# Patient Record
Sex: Male | Born: 1990 | Race: White | Hispanic: No | Marital: Married | Smoking: Never smoker
Health system: Southern US, Community
[De-identification: ages and names within clinical notes are randomized; demographics above are authoritative.]

## PROBLEM LIST (undated history)

## (undated) DIAGNOSIS — G4700 Insomnia, unspecified: Secondary | ICD-10-CM

## (undated) DIAGNOSIS — K219 Gastro-esophageal reflux disease without esophagitis: Secondary | ICD-10-CM

## (undated) DIAGNOSIS — F32A Depression, unspecified: Secondary | ICD-10-CM

## (undated) DIAGNOSIS — I639 Cerebral infarction, unspecified: Secondary | ICD-10-CM

## (undated) HISTORY — DX: Insomnia, unspecified: G47.00

## (undated) HISTORY — PX: KNEE SURGERY: SHX244

## (undated) HISTORY — DX: Gastro-esophageal reflux disease without esophagitis: K21.9

## (undated) HISTORY — DX: Depression, unspecified: F32.A

---

## 2019-04-18 ENCOUNTER — Other Ambulatory Visit: Payer: Self-pay

## 2019-04-18 DIAGNOSIS — Z20822 Contact with and (suspected) exposure to covid-19: Secondary | ICD-10-CM

## 2019-04-19 LAB — NOVEL CORONAVIRUS, NAA: SARS-CoV-2, NAA: NOT DETECTED

## 2019-05-03 ENCOUNTER — Other Ambulatory Visit: Payer: Self-pay

## 2019-05-03 DIAGNOSIS — Z20822 Contact with and (suspected) exposure to covid-19: Secondary | ICD-10-CM

## 2019-05-04 LAB — NOVEL CORONAVIRUS, NAA: SARS-CoV-2, NAA: NOT DETECTED

## 2019-08-06 ENCOUNTER — Ambulatory Visit: Payer: 59 | Attending: Internal Medicine

## 2019-08-06 DIAGNOSIS — Z20822 Contact with and (suspected) exposure to covid-19: Secondary | ICD-10-CM

## 2019-08-08 LAB — NOVEL CORONAVIRUS, NAA: SARS-CoV-2, NAA: NOT DETECTED

## 2021-04-20 ENCOUNTER — Encounter: Payer: Self-pay | Admitting: Neurology

## 2021-06-01 NOTE — Progress Notes (Signed)
NEUROLOGY CONSULTATION NOTE  Bobby Haynes MRN: [id number] DOB: 1990-12-04  Referring provider: [facility] Primary care provider: [facility]  Reason for consult:  headache  Assessment/Plan:   1   Migraine with aura, without status migrainosus, not intractable 2  Tension-type headache, not intractable  Migraine prevention:  He defers preventative medication.  Will try lifestyle modification and possibly vitamins/supplements (magnesium citrate 400mg  daily, riboflavin 400mg  daily, CoQ10 100mg  three times daily Migraine rescue:  rizatriptan 10mg  (Zofran for nausea) Limit use of pain relievers to no more than 2 days out of week to prevent risk of rebound or medication-overuse headache. Keep headache diary Follow up 6 months.    Subjective:  Bobby Haynes is a 30 year old left-handed male who presents for headaches.  History supplemented by referring provider's notes.  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 Location:  across forehead Quality: pounding Intensity:  5-6/10.   Aura:  sometimes preceded by blurred vision Prodrome:  absent Associated symptoms:  Nausea, photophobia, phonophobia.  He denies associated vomiting, autonomic symptoms, unilateral numbness or weakness. Duration:  several months Frequency:  varies but on average once a month Triggers:  change in weather Relieving factors:  rest Activity:  aggravates  September, he had a persistent migraine for 2 weeks.  Went to ED and found to have elevated blood pressure.  2.  Tension-type Headache Onset:  2021 after contracting Covid Location:  across forehead Quality:  throbbing, sometimes pressure Intensity:  3-4/10.   Aura:  absent Prodrome:  absent Associated symptoms:  None.   Duration:  all day Frequency:  every other day  Rescue protocol:  Excedrin (sometimes ibuprofen), Benadryl, Zofran Frequency of pain relievers: 3 to 4 times a  week Current NSAIDS/analgesics:  ibuprofen, Tylenol, Excedrin Current triptans:  none Current ergotamine:  none Current anti-emetic:  Zofran ODT 4mg  Current muscle relaxants:  none Current Antihypertensive medications:  none Current Antidepressant medications:  trazodone 50mg  daily Current Anticonvulsant medications:  none Current anti-CGRP:  none Current Vitamins/Herbal/Supplements:  none Current Antihistamines/Decongestants:  Benadryl, hydroxyzine Other therapy:  none Hormone/birth control:  none Other medications:  Buspar  Past NSAIDS/analgesics:  Tylenol Past abortive triptans:  none Past abortive ergotamine:  none Past muscle relaxants:  none Past anti-emetic:  none Past antihypertensive medications:  none Past antidepressant medications:  none Past anticonvulsant medications:  none Past anti-CGRP:  none Past vitamins/Herbal/Supplements:  none Past antihistamines/decongestants:  none Other past therapies:  none  Caffeine:  3 cups coffee daily.  Soda not daily Diet:  1 gallon of water daily.  Skips breakfast Exercise:  not routine Depression:  no; Anxiety:  yes Other pain:  knee pain (history of multiple surgeries) Sleep:  better with trazodone 6-7 hours a night Family history of headache:  mom and dad (migraines)      PAST MEDICAL HISTORY: History reviewed. No pertinent past medical history.  PAST SURGICAL HISTORY: History reviewed. No pertinent surgical history.  MEDICATIONS: Current Outpatient Medications on File Prior to Visit  Medication Sig Dispense Refill   albuterol (VENTOLIN HFA) 108 (90 Base) MCG/ACT inhaler INHALE 2 PUFFS BY MOUTH EVERY 6 HOURS AS NEEDED FOR WHEEZING     busPIRone (BUSPAR) 10 MG tablet TAKE ONE-HALF TABLET BY MOUTH TWICE A DAY FOR ANXIETY     Cholecalciferol 25 MCG (1000 UT) tablet Take 1 tablet by mouth daily.     dicyclomine (BENTYL) 10 MG capsule Take 10 mg by mouth daily.  hydrOXYzine (ATARAX/VISTARIL) 10 MG tablet Take 10 mg  by mouth as needed.     montelukast (SINGULAIR) 10 MG tablet Take 1 tablet by mouth daily.     traZODone (DESYREL) 50 MG tablet Take 50 mg by mouth daily.     No current facility-administered medications on file prior to visit.      ALLERGIES: No Known Allergies  FAMILY HISTORY: Family History  Problem Relation Age of Onset   Dementia Maternal Grandmother    Dementia Maternal Grandfather     Objective:  Blood pressure (!) 168/91, pulse 88, height 5\' 9"  (1.753 m), weight 255 lb (115.7 kg), SpO2 100 %. General: No acute distress.  Patient appears well-groomed.   Head:  Normocephalic/atraumatic Eyes:  fundi examined but not visualized Neck: supple, no paraspinal tenderness, full range of motion Back: No paraspinal tenderness Heart: regular rate and rhythm Lungs: Clear to auscultation bilaterally. Vascular: No carotid bruits. Neurological Exam: Mental status: alert and oriented to person, place, and time, recent and remote memory intact, fund of knowledge intact, attention and concentration intact, speech fluent and not dysarthric, language intact. Cranial nerves: CN I: not tested CN II: pupils equal, round and reactive to light, visual fields intact CN III, IV, VI:  full range of motion, no nystagmus, no ptosis CN V: facial sensation intact. CN VII: upper and lower face symmetric CN VIII: hearing intact CN IX, X: gag intact, uvula midline CN XI: sternocleidomastoid and trapezius muscles intact CN XII: tongue midline Bulk & Tone: normal, no fasciculations. Motor:  muscle strength 5/5 throughout Sensation:  Pinprick, temperature and vibratory sensation intact. Deep Tendon Reflexes:  2+ throughout,  toes downgoing.   Finger to nose testing:  Without dysmetria.   Heel to shin:  Without dysmetria.   Gait:  Normal station and stride.  Romberg negative.    Thank you for allowing me to take part in the care of this patient.  , DO

## 2021-06-02 ENCOUNTER — Encounter: Payer: Self-pay | Admitting: Neurology

## 2021-06-02 ENCOUNTER — Ambulatory Visit (INDEPENDENT_AMBULATORY_CARE_PROVIDER_SITE_OTHER): Payer: No Typology Code available for payment source | Admitting: Neurology

## 2021-06-02 VITALS — BP 168/91 | HR 88 | Ht 69.0 in | Wt 255.0 lb

## 2021-06-02 DIAGNOSIS — G43109 Migraine with aura, not intractable, without status migrainosus: Secondary | ICD-10-CM | POA: Diagnosis not present

## 2021-06-02 DIAGNOSIS — G44219 Episodic tension-type headache, not intractable: Secondary | ICD-10-CM

## 2021-06-02 MED ORDER — RIZATRIPTAN BENZOATE 10 MG PO TBDP: ORAL_TABLET | ORAL | 5 refills | Status: DC

## 2021-06-02 NOTE — Patient Instructions (Signed)
  Start magnesium citrate 400mg  daily, riboflavin 400mg  daily, coenzyme Q10 100mg  three times daily. Take rizatriptan 10mg  at earliest onset of headache.  May repeat dose once in 2 hours if needed.  Maximum 2 tablets in 24 hours.  Limit use to no more than 2 days out of week to prevent rebound headache Use ondansetron for nausea Stop over the counter analgesics (ibuprofen, Excedrin, Tylenol, etc) Be aware of common food triggers:  - Caffeine:  coffee, black tea, cola, Mt. Dew  - Chocolate  - Dairy:  aged cheeses (brie, blue, cheddar, gouda, [place], provolone, [place], Swiss, etc), chocolate milk, buttermilk, sour cream, limit eggs and yogurt  - Nuts, peanut butter  - Alcohol  - Cereals/grains:  FRESH breads (fresh bagels, sourdough, doughnuts), yeast productions  - Processed/canned/aged/cured meats (pre-packaged deli meats, hotdogs)  - MSG/glutamate:  soy sauce, flavor enhancer, pickled/preserved/marinated foods  - Sweeteners:  aspartame (Equal, Nutrasweet).  Sugar and Splenda are okay  - Vegetables:  legumes (lima beans, lentils, snow peas, fava beans, pinto peans, peas, garbanzo beans), sauerkraut, onions, olives, pickles  - Fruit:  avocados, bananas, citrus fruit (orange, lemon, grapefruit), mango  - Other:  Frozen meals, macaroni and cheese Routine exercise Stay adequately hydrated (aim for 64 oz water daily) Keep headache diary Maintain proper stress management Maintain proper sleep hygiene Do not skip meals

## 2021-06-11 ENCOUNTER — Telehealth: Payer: Self-pay | Admitting: Neurology

## 2021-06-11 NOTE — Telephone Encounter (Signed)
Last ov notes faxed to [phone]

## 2021-06-11 NOTE — Telephone Encounter (Signed)
[facility] called about this appointment. They want the notes sent to fax number [phone]. She stated they are backed up and need them faxed.

## 2021-07-13 ENCOUNTER — Ambulatory Visit: Payer: 59 | Admitting: Neurology

## 2021-09-04 ENCOUNTER — Inpatient Hospital Stay (HOSPITAL_COMMUNITY)
Admission: EM | Admit: 2021-09-04 | Discharge: 2021-09-07 | DRG: 065 | Disposition: A | Discharge status: Home / Self Care | Payer: No Typology Code available for payment source | Attending: Internal Medicine | Admitting: Internal Medicine

## 2021-09-04 ENCOUNTER — Other Ambulatory Visit: Payer: Self-pay

## 2021-09-04 ENCOUNTER — Emergency Department (HOSPITAL_COMMUNITY): Payer: No Typology Code available for payment source

## 2021-09-04 ENCOUNTER — Encounter (HOSPITAL_COMMUNITY): Payer: Self-pay | Admitting: Emergency Medicine

## 2021-09-04 DIAGNOSIS — Z79899 Other long term (current) drug therapy: Secondary | ICD-10-CM

## 2021-09-04 DIAGNOSIS — R29810 Facial weakness: Secondary | ICD-10-CM | POA: Diagnosis present

## 2021-09-04 DIAGNOSIS — K219 Gastro-esophageal reflux disease without esophagitis: Secondary | ICD-10-CM | POA: Diagnosis present

## 2021-09-04 DIAGNOSIS — G8194 Hemiplegia, unspecified affecting left nondominant side: Secondary | ICD-10-CM | POA: Diagnosis present

## 2021-09-04 DIAGNOSIS — G43109 Migraine with aura, not intractable, without status migrainosus: Secondary | ICD-10-CM | POA: Diagnosis present

## 2021-09-04 DIAGNOSIS — Q2112 Patent foramen ovale: Secondary | ICD-10-CM

## 2021-09-04 DIAGNOSIS — R29700 NIHSS score 0: Secondary | ICD-10-CM | POA: Diagnosis present

## 2021-09-04 DIAGNOSIS — Z820 Family history of epilepsy and other diseases of the nervous system: Secondary | ICD-10-CM

## 2021-09-04 DIAGNOSIS — Z20822 Contact with and (suspected) exposure to covid-19: Secondary | ICD-10-CM | POA: Diagnosis present

## 2021-09-04 DIAGNOSIS — I639 Cerebral infarction, unspecified: Secondary | ICD-10-CM | POA: Diagnosis present

## 2021-09-04 DIAGNOSIS — E785 Hyperlipidemia, unspecified: Secondary | ICD-10-CM | POA: Diagnosis present

## 2021-09-04 DIAGNOSIS — I6340 Cerebral infarction due to embolism of unspecified cerebral artery: Principal | ICD-10-CM | POA: Diagnosis present

## 2021-09-04 DIAGNOSIS — G4700 Insomnia, unspecified: Secondary | ICD-10-CM | POA: Diagnosis present

## 2021-09-04 DIAGNOSIS — F32A Depression, unspecified: Secondary | ICD-10-CM | POA: Diagnosis present

## 2021-09-04 LAB — DIFFERENTIAL
Abs Immature Granulocytes: 0.05 10*3/uL (ref 0.00–0.07)
Basophils Absolute: 0.1 10*3/uL (ref 0.0–0.1)
Basophils Relative: 1 %
Eosinophils Absolute: 0.3 10*3/uL (ref 0.0–0.5)
Eosinophils Relative: 2 %
Immature Granulocytes: 1 %
Lymphocytes Relative: 32 %
Lymphs Abs: 3.4 10*3/uL (ref 0.7–4.0)
Monocytes Absolute: 0.5 10*3/uL (ref 0.1–1.0)
Monocytes Relative: 5 %
Neutro Abs: 6.3 10*3/uL (ref 1.7–7.7)
Neutrophils Relative %: 59 %

## 2021-09-04 LAB — I-STAT CHEM 8, ED
BUN: 10 mg/dL (ref 6–20)
Calcium, Ion: 1.14 mmol/L — ABNORMAL LOW (ref 1.15–1.40)
Chloride: 105 mmol/L (ref 98–111)
Creatinine, Ser: 0.9 mg/dL (ref 0.61–1.24)
Glucose, Bld: 101 mg/dL — ABNORMAL HIGH (ref 70–99)
HCT: 45 % (ref 39.0–52.0)
Hemoglobin: 15.3 g/dL (ref 13.0–17.0)
Potassium: 3.5 mmol/L (ref 3.5–5.1)
Sodium: 141 mmol/L (ref 135–145)
TCO2: 24 mmol/L (ref 22–32)

## 2021-09-04 LAB — CBC
HCT: 45.6 % (ref 39.0–52.0)
Hemoglobin: 15 g/dL (ref 13.0–17.0)
MCH: 29.5 pg (ref 26.0–34.0)
MCHC: 32.9 g/dL (ref 30.0–36.0)
MCV: 89.8 fL (ref 80.0–100.0)
Platelets: 395 10*3/uL (ref 150–400)
RBC: 5.08 MIL/uL (ref 4.22–5.81)
RDW: 12.2 % (ref 11.5–15.5)
WBC: 10.6 10*3/uL — ABNORMAL HIGH (ref 4.0–10.5)
nRBC: 0 % (ref 0.0–0.2)

## 2021-09-04 LAB — COMPREHENSIVE METABOLIC PANEL
ALT: 56 U/L — ABNORMAL HIGH (ref 0–44)
AST: 52 U/L — ABNORMAL HIGH (ref 15–41)
Albumin: 4.4 g/dL (ref 3.5–5.0)
Alkaline Phosphatase: 72 U/L (ref 38–126)
Anion gap: 14 (ref 5–15)
BUN: 10 mg/dL (ref 6–20)
CO2: 23 mmol/L (ref 22–32)
Calcium: 9.4 mg/dL (ref 8.9–10.3)
Chloride: 104 mmol/L (ref 98–111)
Creatinine, Ser: 1.12 mg/dL (ref 0.61–1.24)
GFR, Estimated: >60 mL/min (ref >60)
Glucose, Bld: 97 mg/dL (ref 70–99)
Potassium: 3.6 mmol/L (ref 3.5–5.1)
Sodium: 141 mmol/L (ref 135–145)
Total Bilirubin: 0.5 mg/dL (ref 0.3–1.2)
Total Protein: 7.6 g/dL (ref 6.5–8.1)

## 2021-09-04 LAB — PROTIME-INR
INR: 0.9 (ref 0.8–1.2)
Prothrombin Time: 12.6 seconds (ref 11.4–15.2)

## 2021-09-04 LAB — APTT: aPTT: 28 seconds (ref 24–36)

## 2021-09-04 MED ORDER — MIDAZOLAM HCL 2 MG/2ML IJ SOLN
2.0000 mg | Freq: Once | INTRAMUSCULAR | Status: AC
  Administered 2021-09-05: 2 mg via INTRAVENOUS
  Filled 2021-09-04: qty 2

## 2021-09-04 NOTE — ED Provider Notes (Signed)
[facility] EMERGENCY DEPARTMENT Provider Note   CSN: [id number] Arrival date & time: 09/04/21  1634     History  Chief Complaint  Patient presents with   Neurologic Problem    Bobby Haynes is a 31 y.o. male.  Patient is a 31 year old male who presents with an episode of left-sided weakness and numbness.  He said he was having a bowel movement and started having numbness to the left side of his face as well as his left arm and left leg.  He said his left arm and leg felt weak.  He was having to drag his leg as he was walking.  His significant other who is at bedside says that he had some slurred speech during this time.  He said the left side of his face felt numb and was drooping.  He said this lasted about 5 minutes.  No history of similar symptoms in the past.  He does have a history of migraines and is followed by neurology.  He has never had any neurologic symptoms with his migraines.  He complains of a mild headache now but he thinks it is more related to him drinking alcohol last night.  He is not on anticoagulants.  No recent head injury.  He denies any drug use.  No significant past medical history.  No recent COVID symptoms.      Home Medications Prior to Admission medications   Medication Sig Start Date End Date Taking? Authorizing Provider  albuterol (VENTOLIN HFA) 108 (90 Base) MCG/ACT inhaler INHALE 2 PUFFS BY MOUTH EVERY 6 HOURS AS NEEDED FOR WHEEZING 09/21/20   [provider]  busPIRone (BUSPAR) 10 MG tablet TAKE ONE-HALF TABLET BY MOUTH TWICE A DAY FOR ANXIETY 04/14/21   [provider]  Cholecalciferol 25 MCG (1000 UT) tablet Take 1 tablet by mouth daily. 09/25/20   [provider]  dicyclomine (BENTYL) 10 MG capsule Take 10 mg by mouth daily. 11/02/20   [provider]  hydrOXYzine (ATARAX/VISTARIL) 10 MG tablet Take 10 mg by mouth as needed. 04/14/21   [provider]  montelukast (SINGULAIR) 10 MG  tablet Take 1 tablet by mouth daily. 03/01/21   [provider]  rizatriptan (MAXALT-MLT) 10 MG disintegrating tablet Take 1 tablet earliest onset of migraine.  May repeat in 2 hours if needed.  Maximum 2 tablets in 24 hours. 06/02/21   Pieter Partridge, DO  traZODone (DESYREL) 50 MG tablet Take 50 mg by mouth daily. 04/14/21   [provider]      Allergies    Patient has no known allergies.    Review of Systems   Review of Systems  Constitutional:  Negative for chills, diaphoresis, fatigue and fever.  HENT:  Negative for congestion, rhinorrhea and sneezing.   Eyes: Negative.   Respiratory:  Negative for cough, chest tightness and shortness of breath.   Cardiovascular:  Negative for chest pain and leg swelling.  Gastrointestinal:  Negative for abdominal pain, blood in stool, diarrhea, nausea and vomiting.  Genitourinary:  Negative for difficulty urinating, flank pain, frequency and hematuria.  Musculoskeletal:  Negative for arthralgias and back pain.  Skin:  Negative for rash.  Neurological:  Positive for speech difficulty, weakness, numbness and headaches. Negative for dizziness.   Physical Exam Updated Vital Signs BP (!) 146/89    Pulse 97    Temp 97.9 F (36.6 C) (Oral)    Resp 16    SpO2 98%  Physical Exam Constitutional:  Appearance: He is well-developed.  HENT:     Head: Normocephalic and atraumatic.  Eyes:     Pupils: Pupils are equal, round, and reactive to light.  Cardiovascular:     Rate and Rhythm: Normal rate and regular rhythm.     Heart sounds: Normal heart sounds.  Pulmonary:     Effort: Pulmonary effort is normal. No respiratory distress.     Breath sounds: Normal breath sounds. No wheezing or rales.  Chest:     Chest wall: No tenderness.  Abdominal:     General: Bowel sounds are normal.     Palpations: Abdomen is soft.     Tenderness: There is no abdominal tenderness. There is no guarding or rebound.  Musculoskeletal:        General:  Normal range of motion.     Cervical back: Normal range of motion and neck supple.  Lymphadenopathy:     Cervical: No cervical adenopathy.  Skin:    General: Skin is warm and dry.     Findings: No rash.  Neurological:     Mental Status: He is alert and oriented to person, place, and time.     Comments: Motor 5/5 all extremities Sensation grossly intact to LT all extremities Finger to Nose intact, no pronator drift CN II-XII grossly intact      ED Results / Procedures / Treatments   Labs (all labs ordered are listed, but only abnormal results are displayed) Labs Reviewed  CBC - Abnormal; Notable for the following components:      Result Value   WBC 10.6 (*)    All other components within normal limits  COMPREHENSIVE METABOLIC PANEL - Abnormal; Notable for the following components:   AST 52 (*)    ALT 56 (*)    All other components within normal limits  I-STAT CHEM 8, ED - Abnormal; Notable for the following components:   Glucose, Bld 101 (*)    Calcium, Ion 1.14 (*)    All other components within normal limits  PROTIME-INR  APTT  DIFFERENTIAL    EKG EKG Interpretation  Date/Time:  Saturday September 04 2021 16:55:20 EST Ventricular Rate:  106 PR Interval:  142 QRS Duration: 92 QT Interval:  330 QTC Calculation: 438 R Axis:   75 Text Interpretation: Sinus tachycardia Incomplete right bundle branch block Cannot rule out Anterior infarct , age undetermined Abnormal ECG No previous ECGs available No old tracing to compare Confirmed by Malvin Johns [phone]) on 09/04/2021 5:38:19 PM  Radiology CT HEAD WO CONTRAST  Result Date: 09/04/2021 CLINICAL DATA:  Numbness or tingling, paresthesia (Ped 0-17y). Left-sided weakness, numbness EXAM: CT HEAD WITHOUT CONTRAST TECHNIQUE: Contiguous axial images were obtained from the base of the skull through the vertex without intravenous contrast. RADIATION DOSE REDUCTION: This exam was performed according to the departmental  dose-optimization program which includes automated exposure control, adjustment of the mA and/or kV according to patient size and/or use of iterative reconstruction technique. COMPARISON:  None. FINDINGS: Brain: No acute intracranial abnormality. Specifically, no hemorrhage, hydrocephalus, mass lesion, acute infarction, or significant intracranial injury. Vascular: No hyperdense vessel or unexpected calcification. Skull: No acute calvarial abnormality. Sinuses/Orbits: No acute findings. Mucosal thickening in the maxillary sinuses. Other: None IMPRESSION: No acute intracranial abnormality. Chronic maxillary sinusitis. Electronically Signed   By: Rolm Baptise M.D.   On: 09/04/2021 17:49    Procedures Procedures    Medications Ordered in ED Medications - No data to display  ED Course/ Medical Decision Making/ A&P  Medical Decision Making Amount and/or Complexity of Data Reviewed Radiology: ordered.  Risk Prescription drug management.   19:11 consulted with Dr. Leonel Ramsay.  He recommends MR I of the brain and MRA of the head and neck.  If these are negative, symptoms are likely related to a complex migraine and he can follow-up with his outpatient neurologist.  Would recommend starting baby ASA.  Patient presents with an episode of left-sided weakness and facial drooping.  His symptoms resolved after about 5 minutes.  His head CT shows no concerning findings.  His labs were reviewed by me and are nonconcerning.  I reviewed his old chart and neurology records.  It does not appear that he has had similar symptoms in the past with his migraines.  He has a mild headache now but does not seem to have a current migraine.  Patient is awaiting MRI/MRA.  Care turned over to Dr. Dayna Barker pending these tests.  {Final Clinical Impression(s) / ED Diagnoses Final diagnoses:  None    Rx / DC Orders ED Discharge Orders     None         Malvin Johns, MD 09/04/21 2318

## 2021-09-04 NOTE — ED Notes (Signed)
Patient transported to CT 

## 2021-09-04 NOTE — ED Provider Notes (Signed)
11:21 PM Assumed care from Dr. Fredderick Phenix, please see their note for full history, physical and decision making until this point. In brief this is a 31 y.o. year old male who presented to the ED tonight with Neurologic Problem     Patient here with some left-sided symptoms concerning for possible TIA versus less likely complicated migraine.  Neurology phone consult and recommended MRI MRA and disposition pending on results.  MRI with acute stroke. D/w Neurology, requests medicine admit, will see for consult. Concern for PFO.   Labs, studies and imaging reviewed by myself and considered in medical decision making if ordered. Imaging interpreted by radiology.  Labs Reviewed  CBC - Abnormal; Notable for the following components:      Result Value   WBC 10.6 (*)    All other components within normal limits  COMPREHENSIVE METABOLIC PANEL - Abnormal; Notable for the following components:   AST 52 (*)    ALT 56 (*)    All other components within normal limits  I-STAT CHEM 8, ED - Abnormal; Notable for the following components:   Glucose, Bld 101 (*)    Calcium, Ion 1.14 (*)    All other components within normal limits  PROTIME-INR  APTT  DIFFERENTIAL    CT HEAD WO CONTRAST  Final Result    MR BRAIN WO CONTRAST    (Results Pending)  MR ANGIO HEAD WO CONTRAST    (Results Pending)  MR Angiogram Neck W or Wo Contrast    (Results Pending)    No follow-ups on file.    Mckenzye Cutright, Barbara Cower, MD 09/05/21 [phone]

## 2021-09-04 NOTE — ED Triage Notes (Signed)
Pt here for sudden onset L side weakness and numbness that started at 1600 today, per pt it lasted approx 5 minutes before going away. Pt has no unilateral weakness, droop, numbness at present.

## 2021-09-05 ENCOUNTER — Emergency Department (HOSPITAL_COMMUNITY): Payer: No Typology Code available for payment source

## 2021-09-05 ENCOUNTER — Observation Stay (HOSPITAL_COMMUNITY): Payer: No Typology Code available for payment source

## 2021-09-05 ENCOUNTER — Observation Stay (HOSPITAL_BASED_OUTPATIENT_CLINIC_OR_DEPARTMENT_OTHER): Payer: No Typology Code available for payment source

## 2021-09-05 DIAGNOSIS — I639 Cerebral infarction, unspecified: Secondary | ICD-10-CM | POA: Diagnosis not present

## 2021-09-05 DIAGNOSIS — I6389 Other cerebral infarction: Secondary | ICD-10-CM | POA: Diagnosis not present

## 2021-09-05 LAB — ECHOCARDIOGRAM COMPLETE BUBBLE STUDY
AR max vel: 2.77 cm2
AV Area VTI: 2.78 cm2
AV Area mean vel: 2.78 cm2
AV Mean grad: 3 mmHg
AV Peak grad: 5.8 mmHg
Ao pk vel: 1.2 m/s
Area-P 1/2: 4.96 cm2
S' Lateral: 3.2 cm

## 2021-09-05 LAB — RAPID URINE DRUG SCREEN, HOSP PERFORMED
Amphetamines: NOT DETECTED
Barbiturates: NOT DETECTED
Benzodiazepines: POSITIVE — AB
Cocaine: NOT DETECTED
Opiates: NOT DETECTED
Tetrahydrocannabinol: NOT DETECTED

## 2021-09-05 LAB — LIPID PANEL
Cholesterol: 230 mg/dL — ABNORMAL HIGH (ref 0–200)
HDL: 49 mg/dL (ref >40)
LDL Cholesterol: 158 mg/dL — ABNORMAL HIGH (ref 0–99)
Total CHOL/HDL Ratio: 4.7 RATIO
Triglycerides: 115 mg/dL (ref <150)
VLDL: 23 mg/dL (ref 0–40)

## 2021-09-05 LAB — RESP PANEL BY RT-PCR (FLU A&B, COVID) ARPGX2
Influenza A by PCR: NEGATIVE
Influenza B by PCR: NEGATIVE
SARS Coronavirus 2 by RT PCR: NEGATIVE

## 2021-09-05 LAB — ANTITHROMBIN III: AntiThromb III Func: 109 % (ref 75–120)

## 2021-09-05 LAB — HIV ANTIBODY (ROUTINE TESTING W REFLEX): HIV Screen 4th Generation wRfx: NONREACTIVE

## 2021-09-05 MED ORDER — IOHEXOL 350 MG/ML SOLN
75.0000 mL | Freq: Once | INTRAVENOUS | Status: AC | PRN
  Administered 2021-09-05: 75 mL via INTRAVENOUS

## 2021-09-05 MED ORDER — BUSPIRONE HCL 10 MG PO TABS
5.0000 mg | ORAL_TABLET | Freq: Every morning | ORAL | Status: DC
  Administered 2021-09-05 – 2021-09-07 (×3): 5 mg via ORAL
  Filled 2021-09-05 (×3): qty 1

## 2021-09-05 MED ORDER — CLOPIDOGREL BISULFATE 75 MG PO TABS: 75.0000 mg | ORAL_TABLET | Freq: Every day | ORAL | Status: DC

## 2021-09-05 MED ORDER — CLOPIDOGREL BISULFATE 300 MG PO TABS
300.0000 mg | ORAL_TABLET | Freq: Once | ORAL | Status: AC
  Administered 2021-09-05: 300 mg via ORAL
  Filled 2021-09-05: qty 1

## 2021-09-05 MED ORDER — TRAZODONE HCL 50 MG PO TABS
50.0000 mg | ORAL_TABLET | Freq: Every day | ORAL | Status: DC
  Administered 2021-09-05 – 2021-09-06 (×2): 50 mg via ORAL
  Filled 2021-09-05 (×2): qty 1

## 2021-09-05 MED ORDER — ENOXAPARIN SODIUM 40 MG/0.4ML IJ SOSY
40.0000 mg | PREFILLED_SYRINGE | INTRAMUSCULAR | Status: DC
  Administered 2021-09-05 – 2021-09-07 (×3): 40 mg via SUBCUTANEOUS
  Filled 2021-09-05 (×3): qty 0.4

## 2021-09-05 MED ORDER — GADOBUTROL 1 MMOL/ML IV SOLN
10.0000 mL | Freq: Once | INTRAVENOUS | Status: AC | PRN
  Administered 2021-09-05: 10 mL via INTRAVENOUS

## 2021-09-05 MED ORDER — MONTELUKAST SODIUM 10 MG PO TABS: 10.0000 mg | ORAL_TABLET | Freq: Every day | ORAL | Status: DC

## 2021-09-05 MED ORDER — STROKE: EARLY STAGES OF RECOVERY BOOK
Freq: Once | Status: AC
  Filled 2021-09-05: qty 1

## 2021-09-05 MED ORDER — MAGNESIUM 30 MG PO TABS: 30.0000 mg | ORAL_TABLET | Freq: Every day | ORAL | Status: DC

## 2021-09-05 MED ORDER — ACETAMINOPHEN 650 MG RE SUPP: 650.0000 mg | RECTAL | Status: DC | PRN

## 2021-09-05 MED ORDER — ASPIRIN EC 81 MG PO TBEC
81.0000 mg | DELAYED_RELEASE_TABLET | Freq: Every day | ORAL | Status: DC
  Administered 2021-09-05 – 2021-09-07 (×3): 81 mg via ORAL
  Filled 2021-09-05 (×3): qty 1

## 2021-09-05 MED ORDER — ACETAMINOPHEN 160 MG/5ML PO SOLN: 650.0000 mg | ORAL | Status: DC | PRN

## 2021-09-05 MED ORDER — ATORVASTATIN CALCIUM 40 MG PO TABS
40.0000 mg | ORAL_TABLET | Freq: Every day | ORAL | Status: DC
  Administered 2021-09-05 – 2021-09-07 (×3): 40 mg via ORAL
  Filled 2021-09-05 (×3): qty 1

## 2021-09-05 MED ORDER — CLOPIDOGREL BISULFATE 75 MG PO TABS
75.0000 mg | ORAL_TABLET | Freq: Every day | ORAL | Status: DC
  Administered 2021-09-06 – 2021-09-07 (×2): 75 mg via ORAL
  Filled 2021-09-05 (×2): qty 1

## 2021-09-05 MED ORDER — ACETAMINOPHEN 325 MG PO TABS: 650.0000 mg | ORAL_TABLET | ORAL | Status: DC | PRN

## 2021-09-05 NOTE — Evaluation (Signed)
Occupational Therapy Evaluation Patient Details Name: Bobby Haynes MRN: [id number] DOB: 11-Nov-1990 Today's Date: 09/05/2021   History of Present Illness Pt is a 31 y.o. male who presented 09/04/21 with L sided numbness, facial droop, and slurred speech that lasted 5 minutes. 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 PMH: obesity, migraines, depression, GERD   Clinical Impression   Pt admitted with the above diagnosis.  He presents to OT with no deficits and appears to be back to baseline.  He is able to perform ADLs and functional mobility independently.  No apparent visual or cognitive deficits noted with testing.   OT will sign off.       Recommendations for follow up therapy are one component of a multi-disciplinary discharge planning process, led by the attending physician.  Recommendations may be updated based on patient status, additional functional criteria and insurance authorization.   Follow Up Recommendations  No OT follow up    Assistance Recommended at Discharge None  Patient can return home with the following      Functional Status Assessment  Patient has not had a recent decline in their functional status  Equipment Recommendations  None recommended by OT    Recommendations for Other Services       Precautions / Restrictions Precautions Precautions: None      Mobility Bed Mobility Overal bed mobility: Independent                  Transfers Overall transfer level: Independent                        Balance Overall balance assessment: Independent                                         ADL either performed or assessed with clinical judgement   ADL Overall ADL's : Independent                                             Vision Baseline Vision/History: 1 Wears glasses Ability to See in Adequate Light: 0  Adequate Patient Visual Report: No change from baseline Vision Assessment?: Yes Eye Alignment: Within Functional Limits Ocular Range of Motion: Within Functional Limits Alignment/Gaze Preference: Within Defined Limits Tracking/Visual Pursuits: Able to track stimulus in all quads without difficulty Saccades: Within functional limits Convergence: Within functional limits Visual Fields: No apparent deficits     Perception     Praxis      Pertinent Vitals/Pain Pain Assessment Pain Assessment: No/denies pain     Hand Dominance Left   Extremity/Trunk Assessment Upper Extremity Assessment Upper Extremity Assessment: Overall WFL for tasks assessed   Lower Extremity Assessment Lower Extremity Assessment: Overall WFL for tasks assessed   Cervical / Trunk Assessment Cervical / Trunk Assessment: Normal   Communication Communication Communication: No difficulties   Cognition Arousal/Alertness: Awake/alert Behavior During Therapy: WFL for tasks assessed/performed Overall Cognitive Status: Within Functional Limits for tasks assessed                                 General Comments: Pt scored 2/28 on the Short Blessed Test -  only error was with auditory recall. He was able to perform serial 7 subtraction while ambulating without difficulty and was able to perform minimally challenging path finding     General Comments  pt able to turn, pick up items from floor, read signage in hallway without LOB.  He reports mild dizziness with head turns but does not loose balance.  Discussed post stroke fatigue with pt    Exercises     Shoulder Instructions      Home Living Family/patient expects to be discharged to:: Private residence Living Arrangements: Spouse/significant other Available Help at Discharge: Family Type of Home: Apartment Home Access: Stairs to enter Secretary/administrator of Steps: full flight Entrance Stairs-Rails: Left;Right Home Layout: One level      Bathroom Shower/Tub: Chief Strategy Officer: Standard     Home Equipment: None          Prior Functioning/Environment Prior Level of Function : Independent/Modified Independent;Driving;Working/employed             Mobility Comments: No AD ADLs Comments: 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 Reports his baseline auditory memory is not great and wife confirms.        OT Problem List: Decreased activity tolerance      OT Treatment/Interventions:      OT Goals(Current goals can be found in the care plan section) Acute Rehab OT Goals Patient Stated Goal: to go home soon OT Goal Formulation: All assessment and education complete, DC therapy  OT Frequency:      Co-evaluation              AM-PAC OT "6 Clicks" Daily Activity     Outcome Measure Help from another person eating meals?: None Help from another person taking care of personal grooming?: None Help from another person toileting, which includes using toliet, bedpan, or urinal?: None Help from another person bathing (including washing, rinsing, drying)?: None Help from another person to put on and taking off regular upper body clothing?: None Help from another person to put on and taking off regular lower body clothing?: None 6 Click Score: 24   End of Session Nurse Communication: Mobility status  Activity Tolerance: Patient tolerated treatment well Patient left: in bed;with family/visitor present  OT Visit Diagnosis: Dizziness and giddiness (R42)                Time: 8003-4917 OT Time Calculation (min): 16 min Charges:  OT General Charges $OT Visit: 1 Visit OT Evaluation $OT Eval Low Complexity: 1 Low  Eber Jones., OTR/L Acute Rehabilitation Services Pager [phone] Office [phone]   Jeani Hawking M 09/05/2021, 10:47 AM

## 2021-09-05 NOTE — Progress Notes (Signed)
°  Echocardiogram 2D Echocardiogram has been performed.  Bobby Haynes 09/05/2021, 8:27 AM

## 2021-09-05 NOTE — Progress Notes (Signed)
PT Cancellation & Discharge Note  Patient Details Name: Bobby Haynes MRN: [id number] DOB: [birth year]   Cancelled Treatment:    Reason Eval/Treat Not Completed: PT screened, no needs identified, will sign off. OT communicated with PT that pt is back to baseline, IND, with no balance deficits except some minor dizziness with changing head positions but no overt LOB. No PT needs identified, PT will sign off. Thank you.  Raymond Gurney, PT, DPT Acute Rehabilitation Services  Pager: [phone] Office: [phone]    Jewel Baize 09/05/2021, 10:59 AM

## 2021-09-05 NOTE — ED Notes (Signed)
Pt up with spouse to walk around unit

## 2021-09-05 NOTE — Progress Notes (Signed)
VASCULAR LAB    Bilateral lower extremity venous duplex has been performed.  See CV proc for preliminary results.   Jahmiyah Dullea, RVT 09/05/2021, 8:34 AM

## 2021-09-05 NOTE — H&P (View-Only) (Signed)
° ° °  [facility] has been requested to perform a transesophageal echocardiogram on Bobby Haynes for stroke.  After careful review of history and examination, the risks and benefits of transesophageal echocardiogram have been explained including risks of esophageal damage, perforation (1:10,000 risk), bleeding, pharyngeal hematoma as well as other potential complications associated with conscious sedation including aspiration, arrhythmia, respiratory failure and death. Alternatives to treatment were discussed, questions were answered. Patient is willing to proceed.   Tereso Newcomer, PA-C  09/05/2021 5:21 PM

## 2021-09-05 NOTE — ED Notes (Signed)
Pt given meal tray.

## 2021-09-05 NOTE — Progress Notes (Addendum)
STROKE TEAM PROGRESS NOTE   INTERVAL HISTORY His wife is at the bedside.  Patient reports having sudden onset left sided weakness, blurry vision, and dysarthria that lasted approximately 5 minutes. Patient denies having residual neurological deficits. Patient has hx of migraine with aura and takes medication prn. Patient reports he did have a headache that morning for which he took an ibuprofen.  Head CT showed no acute intracranial abnormality.  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 MRA shows wide patency of both carotid artery and R vertebral artery diffusely hypoplastic and terminates in PICA, dominant L vertebral artery. CTA also consistent with no abnormal anterior or posterior circulation.   Discussed plan to have TEE for tomorrow. On ASA+Plavix for DAPT, started lipitor 40 for dyslipidemia. LDL cholesterol 158, A1c pending.  Vitals:   09/05/21 0045 09/05/21 0339 09/05/21 0700 09/05/21 0737  BP: (!) 146/93 (!) 146/85  137/77  Pulse: 87 78  85  Resp: 19 18 18 18   Temp:      TempSrc:      SpO2: 96% 97%  97%   CBC:  Recent Labs  Lab 09/04/21 1650 09/04/21 1706  WBC 10.6*  --   NEUTROABS 6.3  --   HGB 15.0 15.3  HCT 45.6 45.0  MCV 89.8  --   PLT 395  --    Basic Metabolic Panel:  Recent Labs  Lab 09/04/21 1650 09/04/21 1706  NA 141 141  K 3.6 3.5  CL 104 105  CO2 23  --   GLUCOSE 97 101*  BUN 10 10  CREATININE 1.12 0.90  CALCIUM 9.4  --    Lipid Panel:  Recent Labs  Lab 09/05/21 0635  CHOL 230*  TRIG 115  HDL 49  CHOLHDL 4.7  VLDL 23  LDLCALC 409158*   HgbA1c: No results for input(s): HGBA1C in the last 168 hours. Urine Drug Screen:  Recent Labs  Lab 09/05/21 0543  LABOPIA NONE DETECTED  COCAINSCRNUR NONE DETECTED  LABBENZ POSITIVE*  AMPHETMU NONE DETECTED  THCU NONE DETECTED  LABBARB NONE DETECTED    Alcohol Level No results for input(s): ETH in the last 168 hours.  IMAGING  past 24 hours CT ANGIO HEAD W OR WO CONTRAST  Result Date: 09/05/2021 CLINICAL DATA:  31 year old male with tiny posterior right MCA cortical infarct on brain MRI this morning. EXAM: CT ANGIOGRAPHY HEAD AND NECK TECHNIQUE: Multidetector CT imaging of the head and neck was performed using the standard protocol during bolus administration of intravenous contrast. Multiplanar CT image reconstructions and MIPs were obtained to evaluate the vascular anatomy. Carotid stenosis measurements (when applicable) are obtained utilizing NASCET criteria, using the distal internal carotid diameter as the denominator. RADIATION DOSE REDUCTION: This exam was performed according to the departmental dose-optimization program which includes automated exposure control, adjustment of the mA and/or kV according to patient size and/or use of iterative reconstruction technique. CONTRAST:  75mL OMNIPAQUE IOHEXOL 350 MG/ML SOLN COMPARISON:  Brain MRI, MRA head and neck 0110 hours today. Head CT yesterday. FINDINGS: CT HEAD Brain: Small posterior right frontal lobe cortical infarct is not apparent by CT. No midline shift, ventriculomegaly, mass effect, evidence of mass lesion, intracranial hemorrhage. Gray-white matter differentiation is within normal limits throughout the brain. Normal cerebral volume. Calvarium and skull base: No acute osseous abnormality identified. Paranasal sinuses: Stable maxillary sinus mucosal thickening and/or retention cysts. Stable scattered mild ethmoid mucosal thickening. Tympanic cavities are clear. Stable right mastoid  effusion. Orbits: Visualized orbits and scalp soft tissues are within normal limits. CTA NECK Skeleton: No osseous abnormality identified. Upper chest: Negative. Other neck: Negative. Aortic arch: 3 vessel arch configuration. No arch atherosclerosis. Right carotid system: Mildly obscured right CCA origin due to dense right subclavian venous contrast streak artifact. Otherwise the right CCA,  right carotid bifurcation and cervical right ICA appear normal. Left carotid system: Normal. Vertebral arteries: Right subclavian artery origin obscured by dense right subclavian venous contrast. Non dominant right vertebral artery origin appears normal. 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 The right vertebral is patent to the skull base with no stenosis identified. Normal proximal left subclavian artery dominant left vertebral artery origin, and cervical left vertebral artery. CTA HEAD Posterior circulation: Left vertebral artery supplies the basilar. Diminutive right V4 segment terminates in PICA. No distal vertebral or basilar stenosis. Patent SCA and PCA origins. Fetal type right PCA origin. Left posterior communicating artery diminutive or absent. Bilateral PCA branches are within normal limits. Anterior circulation: Both ICA siphons are patent. No siphon plaque or stenosis identified. Right siphon appears somewhat dominant. Normal right posterior communicating artery origin. Patent carotid termini, MCA and ACA origins. Mildly dominant right ACA A1. Normal anterior communicating artery. Bilateral ACA branches are tortuous but otherwise within normal limits. Left MCA M1 segment, bifurcation, and left MCA branches are within normal limits. Right MCA M1 segment and bifurcation are patent without stenosis. Right MCA branches are within normal limits. Venous sinuses: Early contrast timing but grossly patent. Anatomic variants: Dominant left vertebral artery supplies the basilar. Diminutive right vertebral terminates in PICA. Fetal type right PCA origin and mildly dominant right ACA A1. Review of the MIP images confirms the above findings IMPRESSION: 1. Normal CTA Head and Neck, with normal anatomic variation of the anterior and posterior circulation. 2. Small right frontal lobe posterior cortical infarct  is not apparent by CT. No new intracranial abnormality. Electronically Signed   By: Odessa Fleming M.D.   On: 09/05/2021 07:43   CT HEAD WO CONTRAST  Result Date: 09/04/2021 CLINICAL DATA:  Numbness or tingling, paresthesia (Ped 0-17y). Left-sided weakness, numbness EXAM: CT HEAD WITHOUT CONTRAST TECHNIQUE: Contiguous axial images were obtained from the base of the skull through the vertex without intravenous contrast. RADIATION DOSE REDUCTION: This exam was performed according to the departmental dose-optimization program which includes automated exposure control, adjustment of the mA and/or kV according to patient size and/or use of iterative reconstruction technique. COMPARISON:  None. FINDINGS: Brain: No acute intracranial abnormality. Specifically, no hemorrhage, hydrocephalus, mass lesion, acute infarction, or significant intracranial injury. Vascular: No hyperdense vessel or unexpected calcification. Skull: No acute calvarial abnormality. Sinuses/Orbits: No acute findings. Mucosal thickening in the maxillary sinuses. Other: None IMPRESSION: No acute intracranial abnormality. Chronic maxillary sinusitis. Electronically Signed   By: Charlett Nose M.D.   On: 09/04/2021 17:49   CT ANGIO NECK W OR WO CONTRAST  Result Date: 09/05/2021 CLINICAL DATA:  31 year old male with tiny posterior right MCA cortical infarct on brain MRI this morning. EXAM: CT ANGIOGRAPHY HEAD AND NECK TECHNIQUE: Multidetector CT imaging of the head and neck was performed using the standard protocol during bolus administration of intravenous contrast. Multiplanar CT image reconstructions and MIPs were obtained to evaluate the vascular anatomy. Carotid stenosis measurements (when applicable) are obtained utilizing NASCET criteria, using the distal internal carotid diameter as the denominator. RADIATION DOSE REDUCTION: This exam was performed according  to the departmental dose-optimization program which includes automated exposure control,  adjustment of the mA and/or kV according to patient size and/or use of iterative reconstruction technique. CONTRAST:  75mL OMNIPAQUE IOHEXOL 350 MG/ML SOLN COMPARISON:  Brain MRI, MRA head and neck 0110 hours today. Head CT yesterday. FINDINGS: CT HEAD Brain: Small posterior right frontal lobe cortical infarct is not apparent by CT. No midline shift, ventriculomegaly, mass effect, evidence of mass lesion, intracranial hemorrhage. Gray-white matter differentiation is within normal limits throughout the brain. Normal cerebral volume. Calvarium and skull base: No acute osseous abnormality identified. Paranasal sinuses: Stable maxillary sinus mucosal thickening and/or retention cysts. Stable scattered mild ethmoid mucosal thickening. Tympanic cavities are clear. Stable right mastoid effusion. Orbits: Visualized orbits and scalp soft tissues are within normal limits. CTA NECK Skeleton: No osseous abnormality identified. Upper chest: Negative. Other neck: Negative. Aortic arch: 3 vessel arch configuration. No arch atherosclerosis. Right carotid system: Mildly obscured right CCA origin due to dense right subclavian venous contrast streak artifact. Otherwise the right CCA, right carotid bifurcation and cervical right ICA appear normal. Left carotid system: Normal. Vertebral arteries: Right subclavian artery origin obscured by dense right subclavian venous contrast. Non dominant right vertebral artery origin appears normal. 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 The right vertebral is patent to the skull base with no stenosis identified. Normal proximal left subclavian artery dominant left vertebral artery origin, and cervical left vertebral artery. CTA HEAD Posterior circulation: Left vertebral artery supplies the basilar. Diminutive right V4 segment terminates in PICA. No distal vertebral or basilar stenosis.  Patent SCA and PCA origins. Fetal type right PCA origin. Left posterior communicating artery diminutive or absent. Bilateral PCA branches are within normal limits. Anterior circulation: Both ICA siphons are patent. No siphon plaque or stenosis identified. Right siphon appears somewhat dominant. Normal right posterior communicating artery origin. Patent carotid termini, MCA and ACA origins. Mildly dominant right ACA A1. Normal anterior communicating artery. Bilateral ACA branches are tortuous but otherwise within normal limits. Left MCA M1 segment, bifurcation, and left MCA branches are within normal limits. Right MCA M1 segment and bifurcation are patent without stenosis. Right MCA branches are within normal limits. Venous sinuses: Early contrast timing but grossly patent. Anatomic variants: Dominant left vertebral artery supplies the basilar. Diminutive right vertebral terminates in PICA. Fetal type right PCA origin and mildly dominant right ACA A1. Review of the MIP images confirms the above findings IMPRESSION: 1. Normal CTA Head and Neck, with normal anatomic variation of the anterior and posterior circulation. 2. Small right frontal lobe posterior cortical infarct is not apparent by CT. No new intracranial abnormality. Electronically Signed   By: Odessa FlemingH  Hall M.D.   On: 09/05/2021 07:43   MR ANGIO HEAD WO CONTRAST  Result Date: 09/05/2021 CLINICAL DATA:  Initial evaluation for neuro deficit, stroke suspected, left-sided weakness and numbness. EXAM: MRI HEAD WITHOUT CONTRAST MRA HEAD WITHOUT CONTRAST MRA NECK WITHOUT AND WITH CONTRAST TECHNIQUE: Multiplanar, multi-echo pulse sequences of the brain and surrounding structures were acquired without intravenous contrast. Angiographic images of the Circle of Willis were acquired using MRA technique without intravenous contrast. Angiographic images of the neck were acquired using MRA technique without and with intravenous contrast. Carotid stenosis measurements (when  applicable) are obtained utilizing NASCET criteria, using the distal internal carotid diameter as the denominator. CONTRAST:  10 cc of Gadavist. COMPARISON:  Prior head CT from 09/04/2021. FINDINGS: MRI HEAD  FINDINGS Brain: Cerebral volume within normal limits. No significant cerebral white matter disease for age. 6 mm focus of restricted diffusion seen involving the cortical gray matter of the right parietal lobe, postcentral gyrus, consistent with an acute ischemic infarct (series 5, image 88). No associated hemorrhage or mass effect. No other evidence for acute or subacute ischemia. Gray-white matter differentiation otherwise maintained. No other areas of encephalomalacia to suggest chronic cortical infarction. No other acute or chronic intracranial blood products. No mass lesion, midline shift or mass effect no hydrocephalus or extra-axial fluid collection. Pituitary gland suprasellar region normal. Midline structures intact. Vascular: Major intracranial vascular flow voids are maintained. Skull and upper cervical spine: Craniocervical junction within normal limits. Bone marrow signal intensity normal. No scalp soft tissue abnormality. Sinuses/Orbits: Globes and orbital soft tissues within normal limits. Moderate mucosal thickening noted throughout the ethmoidal air cells and maxillary sinuses. Prominent right mastoid effusion. Visualized nasopharynx unremarkable. Other: None. MRA HEAD FINDINGS Anterior circulation: Visualized distal cervical segments of the internal carotid arteries are widely patent with antegrade flow. Petrous, cavernous, and supraclinoid segments patent without stenosis or other abnormality. A1 segments patent. Normal anterior communicating artery complex. Anterior cerebral arteries widely patent without stenosis. No M1 stenosis or occlusion. Normal MCA bifurcations. Distal MCA branches well perfused and symmetric. Posterior circulation: Left vertebral artery dominant and widely patent.  Partially visualized right vertebral artery hypoplastic and terminates in PICA. Both PICA patent. Basilar widely patent to its distal aspect without stenosis. Superior cerebellar arteries patent bilaterally. Left PCA supplied via the basilar. Right PCA supplied via the basilar as well as a robust right posterior communicating artery. Both PCAs well perfused or distal aspects. Anatomic variants: Hypoplastic right vertebral artery terminates in PICA. No aneurysm. MRA NECK FINDINGS Aortic arch: Visualized aortic arch normal caliber with normal branch pattern. No stenosis or other abnormality about the origin the great vessels. Right carotid system: Right common and internal carotid arteries patent without stenosis or evidence for dissection. No atheromatous narrowing or irregularity about the right carotid bulb. Left carotid system: Left common and internal carotid arteries patent without stenosis or evidence for dissection. No atheromatous irregularity or narrowing about the left carotid bulb. Vertebral arteries: Both vertebral arteries arise from the subclavian arteries. Left vertebral artery strongly dominant, with a diffusely hypoplastic right vertebral artery. Vertebral arteries patent without stenosis or dissection. Hypoplastic right vertebral artery terminates in PICA. Other: None IMPRESSION: MRI HEAD IMPRESSION: 1. 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 2. Otherwise normal brain MRI. 3. Moderate paranasal sinus disease with associated right mastoid effusion. MRA HEAD IMPRESSION: Normal intracranial MRA. No large vessel occlusion, hemodynamically significant stenosis, or other acute vascular abnormality. MRA NECK IMPRESSION: 1. Normal MRA of the neck with wide patency of both carotid artery systems. 2. 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 Dominant left vertebral artery widely patent. Electronically Signed   By:  Rise Mu M.D.   On: 09/05/2021 02:46   MR Angiogram Neck W or Wo Contrast  Result Date: 09/05/2021 CLINICAL DATA:  Initial evaluation for neuro deficit, stroke suspected, left-sided weakness and numbness. EXAM: MRI HEAD WITHOUT CONTRAST MRA HEAD WITHOUT CONTRAST MRA NECK WITHOUT AND WITH CONTRAST TECHNIQUE: Multiplanar, multi-echo pulse sequences of the brain and surrounding structures were acquired without intravenous contrast. Angiographic images of the Circle of Willis were acquired using MRA technique without intravenous contrast. Angiographic images of the neck were acquired using MRA technique without and with intravenous  contrast. Carotid stenosis measurements (when applicable) are obtained utilizing NASCET criteria, using the distal internal carotid diameter as the denominator. CONTRAST:  10 cc of Gadavist. COMPARISON:  Prior head CT from 09/04/2021. FINDINGS: MRI HEAD FINDINGS Brain: Cerebral volume within normal limits. No significant cerebral white matter disease for age. 6 mm focus of restricted diffusion seen involving the cortical gray matter of the right parietal lobe, postcentral gyrus, consistent with an acute ischemic infarct (series 5, image 88). No associated hemorrhage or mass effect. No other evidence for acute or subacute ischemia. Gray-white matter differentiation otherwise maintained. No other areas of encephalomalacia to suggest chronic cortical infarction. No other acute or chronic intracranial blood products. No mass lesion, midline shift or mass effect no hydrocephalus or extra-axial fluid collection. Pituitary gland suprasellar region normal. Midline structures intact. Vascular: Major intracranial vascular flow voids are maintained. Skull and upper cervical spine: Craniocervical junction within normal limits. Bone marrow signal intensity normal. No scalp soft tissue abnormality. Sinuses/Orbits: Globes and orbital soft tissues within normal limits. Moderate mucosal  thickening noted throughout the ethmoidal air cells and maxillary sinuses. Prominent right mastoid effusion. Visualized nasopharynx unremarkable. Other: None. MRA HEAD FINDINGS Anterior circulation: Visualized distal cervical segments of the internal carotid arteries are widely patent with antegrade flow. Petrous, cavernous, and supraclinoid segments patent without stenosis or other abnormality. A1 segments patent. Normal anterior communicating artery complex. Anterior cerebral arteries widely patent without stenosis. No M1 stenosis or occlusion. Normal MCA bifurcations. Distal MCA branches well perfused and symmetric. Posterior circulation: Left vertebral artery dominant and widely patent. Partially visualized right vertebral artery hypoplastic and terminates in PICA. Both PICA patent. Basilar widely patent to its distal aspect without stenosis. Superior cerebellar arteries patent bilaterally. Left PCA supplied via the basilar. Right PCA supplied via the basilar as well as a robust right posterior communicating artery. Both PCAs well perfused or distal aspects. Anatomic variants: Hypoplastic right vertebral artery terminates in PICA. No aneurysm. MRA NECK FINDINGS Aortic arch: Visualized aortic arch normal caliber with normal branch pattern. No stenosis or other abnormality about the origin the great vessels. Right carotid system: Right common and internal carotid arteries patent without stenosis or evidence for dissection. No atheromatous narrowing or irregularity about the right carotid bulb. Left carotid system: Left common and internal carotid arteries patent without stenosis or evidence for dissection. No atheromatous irregularity or narrowing about the left carotid bulb. Vertebral arteries: Both vertebral arteries arise from the subclavian arteries. Left vertebral artery strongly dominant, with a diffusely hypoplastic right vertebral artery. Vertebral arteries patent without stenosis or dissection.  Hypoplastic right vertebral artery terminates in PICA. Other: None IMPRESSION: MRI HEAD IMPRESSION: 1. 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 2. Otherwise normal brain MRI. 3. Moderate paranasal sinus disease with associated right mastoid effusion. MRA HEAD IMPRESSION: Normal intracranial MRA. No large vessel occlusion, hemodynamically significant stenosis, or other acute vascular abnormality. MRA NECK IMPRESSION: 1. Normal MRA of the neck with wide patency of both carotid artery systems. 2. 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 Dominant left vertebral artery widely patent. Electronically Signed   By: Rise Mu M.D.   On: 09/05/2021 02:46   MR BRAIN WO CONTRAST  Result Date: 09/05/2021 CLINICAL DATA:  Initial evaluation for neuro deficit, stroke suspected, left-sided weakness and numbness. EXAM: MRI HEAD WITHOUT CONTRAST MRA HEAD WITHOUT CONTRAST MRA NECK WITHOUT AND WITH CONTRAST TECHNIQUE: Multiplanar, multi-echo pulse sequences of the brain and surrounding structures were  acquired without intravenous contrast. Angiographic images of the Circle of Willis were acquired using MRA technique without intravenous contrast. Angiographic images of the neck were acquired using MRA technique without and with intravenous contrast. Carotid stenosis measurements (when applicable) are obtained utilizing NASCET criteria, using the distal internal carotid diameter as the denominator. CONTRAST:  10 cc of Gadavist. COMPARISON:  Prior head CT from 09/04/2021. FINDINGS: MRI HEAD FINDINGS Brain: Cerebral volume within normal limits. No significant cerebral white matter disease for age. 6 mm focus of restricted diffusion seen involving the cortical gray matter of the right parietal lobe, postcentral gyrus, consistent with an acute ischemic infarct (series 5, image 88). No associated hemorrhage or mass effect. No other  evidence for acute or subacute ischemia. Gray-white matter differentiation otherwise maintained. No other areas of encephalomalacia to suggest chronic cortical infarction. No other acute or chronic intracranial blood products. No mass lesion, midline shift or mass effect no hydrocephalus or extra-axial fluid collection. Pituitary gland suprasellar region normal. Midline structures intact. Vascular: Major intracranial vascular flow voids are maintained. Skull and upper cervical spine: Craniocervical junction within normal limits. Bone marrow signal intensity normal. No scalp soft tissue abnormality. Sinuses/Orbits: Globes and orbital soft tissues within normal limits. Moderate mucosal thickening noted throughout the ethmoidal air cells and maxillary sinuses. Prominent right mastoid effusion. Visualized nasopharynx unremarkable. Other: None. MRA HEAD FINDINGS Anterior circulation: Visualized distal cervical segments of the internal carotid arteries are widely patent with antegrade flow. Petrous, cavernous, and supraclinoid segments patent without stenosis or other abnormality. A1 segments patent. Normal anterior communicating artery complex. Anterior cerebral arteries widely patent without stenosis. No M1 stenosis or occlusion. Normal MCA bifurcations. Distal MCA branches well perfused and symmetric. Posterior circulation: Left vertebral artery dominant and widely patent. Partially visualized right vertebral artery hypoplastic and terminates in PICA. Both PICA patent. Basilar widely patent to its distal aspect without stenosis. Superior cerebellar arteries patent bilaterally. Left PCA supplied via the basilar. Right PCA supplied via the basilar as well as a robust right posterior communicating artery. Both PCAs well perfused or distal aspects. Anatomic variants: Hypoplastic right vertebral artery terminates in PICA. No aneurysm. MRA NECK FINDINGS Aortic arch: Visualized aortic arch normal caliber with normal branch  pattern. No stenosis or other abnormality about the origin the great vessels. Right carotid system: Right common and internal carotid arteries patent without stenosis or evidence for dissection. No atheromatous narrowing or irregularity about the right carotid bulb. Left carotid system: Left common and internal carotid arteries patent without stenosis or evidence for dissection. No atheromatous irregularity or narrowing about the left carotid bulb. Vertebral arteries: Both vertebral arteries arise from the subclavian arteries. Left vertebral artery strongly dominant, with a diffusely hypoplastic right vertebral artery. Vertebral arteries patent without stenosis or dissection. Hypoplastic right vertebral artery terminates in PICA. Other: None IMPRESSION: MRI HEAD IMPRESSION: 1. 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 2. Otherwise normal brain MRI. 3. Moderate paranasal sinus disease with associated right mastoid effusion. MRA HEAD IMPRESSION: Normal intracranial MRA. No large vessel occlusion, hemodynamically significant stenosis, or other acute vascular abnormality. MRA NECK IMPRESSION: 1. Normal MRA of the neck with wide patency of both carotid artery systems. 2. 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 Dominant left vertebral artery widely patent. Electronically Signed   By: Rise Mu M.D.   On: 09/05/2021 02:46   VAS [place] LOWER EXTREMITY VENOUS (DVT)  Result Date: 09/05/2021  Lower Venous DVT Study Patient  Name:  Bobby Haynes  Date of Exam:   09/05/2021 Medical Rec #: [id number]           Accession #:    [id number] Date of Birth: [birth year]           Patient Gender: M Patient Age:   55 years Exam Location:  [facility] Procedure:      VAS [place] LOWER EXTREMITY VENOUS (DVT) Referring Phys: MCNEILL KIRKPATRICK --------------------------------------------------------------------------------   Indications: Stroke.  Comparison Study: No prior study on file Performing Technologist: Sherren Kerns RVS  Examination Guidelines: A complete evaluation includes B-mode imaging, spectral Doppler, color Doppler, and power Doppler as needed of all accessible portions of each vessel. Bilateral testing is considered an integral part of a complete examination. Limited examinations for reoccurring indications may be performed as noted. The reflux portion of the exam is performed with the patient in reverse Trendelenburg.  +---------+---------------+---------+-----------+----------+--------------+  RIGHT     Compressibility Phasicity Spontaneity Properties Thrombus Aging  +---------+---------------+---------+-----------+----------+--------------+  CFV       Full            Yes       Yes                                    +---------+---------------+---------+-----------+----------+--------------+  SFJ       Full                                                             +---------+---------------+---------+-----------+----------+--------------+  FV Prox   Full                                                             +---------+---------------+---------+-----------+----------+--------------+  FV Mid    Full                                                             +---------+---------------+---------+-----------+----------+--------------+  FV Distal Full                                                             +---------+---------------+---------+-----------+----------+--------------+  PFV       Full                                                             +---------+---------------+---------+-----------+----------+--------------+  POP       Full            Yes  Yes                                    +---------+---------------+---------+-----------+----------+--------------+  PTV       Full                                                              +---------+---------------+---------+-----------+----------+--------------+  PERO      Full                                                             +---------+---------------+---------+-----------+----------+--------------+   +---------+---------------+---------+-----------+----------+---------------+  LEFT      Compressibility Phasicity Spontaneity Properties Thrombus Aging   +---------+---------------+---------+-----------+----------+---------------+  CFV       Full            Yes       Yes                                     +---------+---------------+---------+-----------+----------+---------------+  SFJ       Full                                                              +---------+---------------+---------+-----------+----------+---------------+  FV Prox   Full                                                              +---------+---------------+---------+-----------+----------+---------------+  FV Mid    Full                                                              +---------+---------------+---------+-----------+----------+---------------+  FV Distal Full                                                              +---------+---------------+---------+-----------+----------+---------------+  PFV       Full                                                              +---------+---------------+---------+-----------+----------+---------------+  POP       Full            Yes       Yes                                     +---------+---------------+---------+-----------+----------+---------------+  PTV                                                        patent by color  +---------+---------------+---------+-----------+----------+---------------+  PERO                                                       patent by color  +---------+---------------+---------+-----------+----------+---------------+     Summary: BILATERAL: -No evidence of popliteal cyst, bilaterally. RIGHT: - There is no evidence of  deep vein thrombosis in the lower extremity.  LEFT: - There is no evidence of deep vein thrombosis in the lower extremity.  *See table(s) above for measurements and observations.    Preliminary     PHYSICAL EXAM  Temp:  [97.9 F (36.6 C)] 97.9 F (36.6 C) (01/21 1640) Pulse Rate:  [78-111] 85 (01/22 0737) Resp:  [15-20] 18 (01/22 0737) BP: (131-164)/(75-105) 137/77 (01/22 0737) SpO2:  [96 %-99 %] 97 % (01/22 0737)  General - Well nourished, well developed, young male in no apparent distress.  Cardiovascular - Regular rhythm and rate.  Mental Status -  Level of arousal and orientation to time, place, and person were intact. Language including expression, naming, repetition, comprehension was assessed and found intact. Attention span and concentration were normal. Recent and remote memory were intact. Fund of Knowledge was assessed and was intact.  Cranial Nerves II - XII - II - Visual field intact OU. III, IV, VI - Extraocular movements intact. V - Facial sensation intact bilaterally. VII - Facial movement intact bilaterally. VIII - Hearing & vestibular intact bilaterally. X - Palate elevates symmetrically. XI - Chin turning & shoulder shrug intact bilaterally. XII - Tongue protrusion intact.  Motor Strength - The patients strength was normal in all extremities and pronator drift was absent.  Bulk was normal and fasciculations were absent.   Motor Tone - Muscle tone was assessed at the neck and appendages and was normal.  Reflexes - The patients reflexes were symmetrical in all extremities and he had no pathological reflexes.  Sensory - Light touch, temperature/pinprick were assessed and were symmetrical.    Coordination - The patient had normal movements in the hands and feet with no ataxia or dysmetria.  Tremor was absent.  Gait and Station - deferred.   ASSESSMENT/PLAN Mr. Bobby Haynes is a 31 y.o. male with history of migraines with visual aura, obesity  presenting with transient 5 minute episode of L sided numbness, weakness, dysarthria, and blurry vision.   Stroke: R postcentral gyrus cortical small infarct secondary to unclear source, concerning for PFO related Head CT: no acute intracranial abnormality.   MRI: 6 mm acute ischemic nonhemorrhagic cortical infarct involving the R parietal lobe, postcentral gyrus MRA: wide patency of both carotid artery and R vertebral artery diffusely  hypoplastic and terminates in PICA, dominant L vertebral artery CTA: no abnormal anterior or posterior circulation.  2D Echo pending Bilateral DVT u/s:  no evidence of DVT Hypercoagulable workup pending: those that resulted were wnl TEE ordered to assess for PFO LDL 158 HgbA1c pending VTE prophylaxis - lovenox    Diet   Diet Heart Room service appropriate? Yes; Fluid consistency: Thin   No antithrombotic prior to admission, now on aspirin 81 mg daily and clopidogrel 75 mg daily. X3 weeks then ASA indefinitely Therapy recommendations:  pending Disposition:  pending   Hyperlipidemia Home meds:  none LDL 158, goal < 70 Add Lipitor 40 mg  Continue statin at discharge   Other Stroke Risk Factors ETOH use, advised to drink no more than 1-2 drink(s) a day Obesity, BMI >/= 30 associated with increased stroke risk, recommend weight loss, diet and exercise as appropriate   Hospital day # 0  Park Pope, MD PGY1 Resident  ATTENDING NOTE: I reviewed above note and agree with the assessment and plan. Pt was seen and examined.   31 year old male with no past medical history admitted for episode of left-sided numbness weakness and slurred speech.  MRI found to have right postcentral gyrus small cortical infarct, embolic pattern.  CT head and neck negative.  EF 60 to 65%, LE venous Doppler no DVT.  LDL 158, A1c pending.  On exam, patient neurology intact, no focal deficit.  Etiology for patient stroke not quite clear, given young age and it happened while  patient having a bowel movement, highly concerning for PFO related.  Recommend TEE for further evaluation.  Continue aspirin and Plavix DAPT.  On Lipitor 40.  We will follow.  For detailed assessment and plan, please refer to above as I have made changes wherever appropriate.   Marvel Plan, MD PhD Stroke Neurology 09/05/2021 6:59 PM    To contact Stroke Continuity provider, please refer to [email or web address]. After hours, contact General Neurology

## 2021-09-05 NOTE — Progress Notes (Addendum)
PROGRESS NOTE                                                                                                                                                                                                             Patient Demographics:    Bobby Haynes, is a 31 y.o. male, DOB - 08/23/1990, ONG:[id number]  Outpatient Primary MD for the [facility] is [facility]    LOS - 0  Admit date - 09/04/2021    Chief Complaint  Patient presents with   Neurologic Problem       Brief Narrative (HPI from H&P)  Bobby Haynes is a 31 y.o. male with medical history significant of obesity.  Pt presents to ED with transient left sided numbness.  Patient was having a BM and immediately after he had onset of L sided numbness, facial droop, slurred speech per wife.  Work-up suggestive of CVA.   Subjective:    Bobby Haynes today has, No headache, No chest pain, No abdominal pain - No Nausea, No new weakness tingling or numbness, no SOB.   Assessment  & Plan :    No notes have been filed under this hospital service. 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  Hypercoagulable work-up pending, LDL was above goal hence statin started, A1c pending, echocardiogram stable without any evidence of PFO, TEE pending. Leg ultrasound negative for DVT, symptoms have improved.  Monitor closely.  2.  Obesity.  Follow with PCP for weight loss.         Condition - Extremely Guarded  Family Communication  :  friend bedside   Code Status :  Full  Consults  :  Neuro  PUD Prophylaxis :    Procedures  :     TTE -  1. Left ventricular ejection fraction, by estimation, is 60 to 65%. The left ventricle has normal function. The left ventricle has no regional wall motion  abnormalities. Left ventricular diastolic parameters were normal.  2. Right ventricular systolic function is normal. The right ventricular size is normal. There is normal pulmonary artery systolic pressure.  3. Left atrial size was mildly dilated.  4. The mitral valve is abnormal. Trivial  mitral valve regurgitation. No evidence of mitral stenosis.  5. The aortic valve is normal in structure. Aortic valve regurgitation is not visualized. No aortic stenosis is present.  6. The inferior vena cava is normal in size with greater than 50% respiratory variability, suggesting right atrial pressure of 3 mmHg.  7. Agitated saline contrast bubble study was negative, with no evidence of any interatrial shunt.  MRI-A -  1. 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 2. Otherwise normal brain MRI. 3. Moderate paranasal sinus disease with associated right mastoid effusion.   MRA HEAD IMPRESSION: Normal intracranial MRA. No large vessel occlusion, hemodynamically significant stenosis, or other acute vascular abnormality. MRA NECK IMPRESSION: 1. Normal MRA of the neck with wide patency of both carotid artery systems. 2. 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 Dominant left vertebral artery widely patent   CTA - 1. Normal CTA Head and Neck, with normal anatomic variation of the anterior and posterior circulation. 2. Small right frontal lobe posterior cortical infarct is not apparent by CT. No new intracranial abnormality.       Disposition Plan  :    Status is: Observation  DVT Prophylaxis  :    enoxaparin (LOVENOX) injection 40 mg Start: 09/05/21 0800   Lab Results  Component Value Date   PLT 395 09/04/2021    Diet :  Diet Order             Diet Heart Room service appropriate? Yes; Fluid consistency: Thin  Diet effective now                    Inpatient Medications  Scheduled Meds:  aspirin EC  81 mg  Oral Daily   atorvastatin  40 mg Oral Daily   busPIRone  5 mg Oral q AM   [START ON 09/06/2021] clopidogrel  75 mg Oral Daily   enoxaparin (LOVENOX) injection  40 mg Subcutaneous Q24H   magnesium  30 mg Oral Daily   traZODone  50 mg Oral QHS   Continuous Infusions: PRN Meds:.acetaminophen **OR** acetaminophen (TYLENOL) oral liquid 160 mg/5 mL **OR** acetaminophen  Antibiotics  :    Anti-infectives (From admission, onward)    None        Time Spent in minutes  30   Susa Raring M.D on 09/05/2021 at 11:50 AM  To page go to [email or web address]   [facility] -  Office  [phone]  See all Orders from today for further details    Objective:   Vitals:   09/05/21 0930 09/05/21 0935 09/05/21 1015 09/05/21 1115  BP:   (!) 151/109 137/68  Pulse: 75  77 78  Resp:   12 20  Temp:  98.2 F (36.8 C)    TempSrc:  Oral    SpO2: 96%  98% 99%    Wt Readings from Last 3 Encounters:  06/02/21 115.7 kg    No intake or output data in the 24 hours ending 09/05/21 1150   Physical Exam  Awake Alert, No new F.N deficits, Normal affect [place].AT,PERRAL Supple Neck, No JVD,   Symmetrical Chest wall movement, Good air movement bilaterally, CTAB RRR,No Gallops,Rubs or new Murmurs,  +ve B.Sounds, Abd Soft, No tenderness,   No Cyanosis, Clubbing or edema       Data Review:    CBC Recent Labs  Lab 09/04/21 1650 09/04/21 1706  WBC 10.6*  --   HGB 15.0 15.3  HCT 45.6  45.0  PLT 395  --   MCV 89.8  --   MCH 29.5  --   MCHC 32.9  --   RDW 12.2  --   LYMPHSABS 3.4  --   MONOABS 0.5  --   EOSABS 0.3  --   BASOSABS 0.1  --     Electrolytes Recent Labs  Lab 09/04/21 1650 09/04/21 1706  NA 141 141  K 3.6 3.5  CL 104 105  CO2 23  --   GLUCOSE 97 101*  BUN 10 10  CREATININE 1.12 0.90  CALCIUM 9.4  --   AST 52*  --   ALT 56*  --   ALKPHOS 72  --   BILITOT 0.5  --   ALBUMIN 4.4  --   INR 0.9  --      ------------------------------------------------------------------------------------------------------------------ Recent Labs    09/05/21 0635  CHOL 230*  HDL 49  LDLCALC 158*  TRIG 115  CHOLHDL 4.7    No results found for: HGBA1C  No results for input(s): TSH, T4TOTAL, T3FREE, THYROIDAB in the last 72 hours.  Invalid input(s): FREET3 ------------------------------------------------------------------------------------------------------------------ ID Labs Recent Labs  Lab 09/04/21 1650 09/04/21 1706  WBC 10.6*  --   PLT 395  --   CREATININE 1.12 0.90   Cardiac Enzymes No results for input(s): CKMB, TROPONINI, MYOGLOBIN in the last 168 hours.  Invalid input(s): CK     Radiology Reports CT ANGIO HEAD W OR WO CONTRAST  Result Date: 09/05/2021 CLINICAL DATA:  31 year old male with tiny posterior right MCA cortical infarct on brain MRI this morning. EXAM: CT ANGIOGRAPHY HEAD AND NECK TECHNIQUE: Multidetector CT imaging of the head and neck was performed using the standard protocol during bolus administration of intravenous contrast. Multiplanar CT image reconstructions and MIPs were obtained to evaluate the vascular anatomy. Carotid stenosis measurements (when applicable) are obtained utilizing NASCET criteria, using the distal internal carotid diameter as the denominator. RADIATION DOSE REDUCTION: This exam was performed according to the departmental dose-optimization program which includes automated exposure control, adjustment of the mA and/or kV according to patient size and/or use of iterative reconstruction technique. CONTRAST:  75mL OMNIPAQUE IOHEXOL 350 MG/ML SOLN COMPARISON:  Brain MRI, MRA head and neck 0110 hours today. Head CT yesterday. FINDINGS: CT HEAD Brain: Small posterior right frontal lobe cortical infarct is not apparent by CT. No midline shift, ventriculomegaly, mass effect, evidence of mass lesion, intracranial hemorrhage. Gray-white matter differentiation  is within normal limits throughout the brain. Normal cerebral volume. Calvarium and skull base: No acute osseous abnormality identified. Paranasal sinuses: Stable maxillary sinus mucosal thickening and/or retention cysts. Stable scattered mild ethmoid mucosal thickening. Tympanic cavities are clear. Stable right mastoid effusion. Orbits: Visualized orbits and scalp soft tissues are within normal limits. CTA NECK Skeleton: No osseous abnormality identified. Upper chest: Negative. Other neck: Negative. Aortic arch: 3 vessel arch configuration. No arch atherosclerosis. Right carotid system: Mildly obscured right CCA origin due to dense right subclavian venous contrast streak artifact. Otherwise the right CCA, right carotid bifurcation and cervical right ICA appear normal. Left carotid system: Normal. Vertebral arteries: Right subclavian artery origin obscured by dense right subclavian venous contrast. Non dominant right vertebral artery origin appears normal. 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 The right vertebral is patent to the skull base with no stenosis identified. Normal proximal left subclavian artery dominant left vertebral artery origin, and cervical left vertebral artery. CTA  HEAD Posterior circulation: Left vertebral artery supplies the basilar. Diminutive right V4 segment terminates in PICA. No distal vertebral or basilar stenosis. Patent SCA and PCA origins. Fetal type right PCA origin. Left posterior communicating artery diminutive or absent. Bilateral PCA branches are within normal limits. Anterior circulation: Both ICA siphons are patent. No siphon plaque or stenosis identified. Right siphon appears somewhat dominant. Normal right posterior communicating artery origin. Patent carotid termini, MCA and ACA origins. Mildly dominant right ACA A1. Normal anterior communicating artery. Bilateral  ACA branches are tortuous but otherwise within normal limits. Left MCA M1 segment, bifurcation, and left MCA branches are within normal limits. Right MCA M1 segment and bifurcation are patent without stenosis. Right MCA branches are within normal limits. Venous sinuses: Early contrast timing but grossly patent. Anatomic variants: Dominant left vertebral artery supplies the basilar. Diminutive right vertebral terminates in PICA. Fetal type right PCA origin and mildly dominant right ACA A1. Review of the MIP images confirms the above findings IMPRESSION: 1. Normal CTA Head and Neck, with normal anatomic variation of the anterior and posterior circulation. 2. Small right frontal lobe posterior cortical infarct is not apparent by CT. No new intracranial abnormality. Electronically Signed   By: Odessa Fleming M.D.   On: 09/05/2021 07:43   CT HEAD WO CONTRAST  Result Date: 09/04/2021 CLINICAL DATA:  Numbness or tingling, paresthesia (Ped 0-17y). Left-sided weakness, numbness EXAM: CT HEAD WITHOUT CONTRAST TECHNIQUE: Contiguous axial images were obtained from the base of the skull through the vertex without intravenous contrast. RADIATION DOSE REDUCTION: This exam was performed according to the departmental dose-optimization program which includes automated exposure control, adjustment of the mA and/or kV according to patient size and/or use of iterative reconstruction technique. COMPARISON:  None. FINDINGS: Brain: No acute intracranial abnormality. Specifically, no hemorrhage, hydrocephalus, mass lesion, acute infarction, or significant intracranial injury. Vascular: No hyperdense vessel or unexpected calcification. Skull: No acute calvarial abnormality. Sinuses/Orbits: No acute findings. Mucosal thickening in the maxillary sinuses. Other: None IMPRESSION: No acute intracranial abnormality. Chronic maxillary sinusitis. Electronically Signed   By: Charlett Nose M.D.   On: 09/04/2021 17:49   CT ANGIO NECK W OR WO  CONTRAST  Result Date: 09/05/2021 CLINICAL DATA:  31 year old male with tiny posterior right MCA cortical infarct on brain MRI this morning. EXAM: CT ANGIOGRAPHY HEAD AND NECK TECHNIQUE: Multidetector CT imaging of the head and neck was performed using the standard protocol during bolus administration of intravenous contrast. Multiplanar CT image reconstructions and MIPs were obtained to evaluate the vascular anatomy. Carotid stenosis measurements (when applicable) are obtained utilizing NASCET criteria, using the distal internal carotid diameter as the denominator. RADIATION DOSE REDUCTION: This exam was performed according to the departmental dose-optimization program which includes automated exposure control, adjustment of the mA and/or kV according to patient size and/or use of iterative reconstruction technique. CONTRAST:  75mL OMNIPAQUE IOHEXOL 350 MG/ML SOLN COMPARISON:  Brain MRI, MRA head and neck 0110 hours today. Head CT yesterday. FINDINGS: CT HEAD Brain: Small posterior right frontal lobe cortical infarct is not apparent by CT. No midline shift, ventriculomegaly, mass effect, evidence of mass lesion, intracranial hemorrhage. Gray-white matter differentiation is within normal limits throughout the brain. Normal cerebral volume. Calvarium and skull base: No acute osseous abnormality identified. Paranasal sinuses: Stable maxillary sinus mucosal thickening and/or retention cysts. Stable scattered mild ethmoid mucosal thickening. Tympanic cavities are clear. Stable right mastoid effusion. Orbits: Visualized orbits and scalp soft tissues are within normal limits. CTA NECK Skeleton: No osseous  abnormality identified. Upper chest: Negative. Other neck: Negative. Aortic arch: 3 vessel arch configuration. No arch atherosclerosis. Right carotid system: Mildly obscured right CCA origin due to dense right subclavian venous contrast streak artifact. Otherwise the right CCA, right carotid bifurcation and cervical  right ICA appear normal. Left carotid system: Normal. Vertebral arteries: Right subclavian artery origin obscured by dense right subclavian venous contrast. Non dominant right vertebral artery origin appears normal. 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 The right vertebral is patent to the skull base with no stenosis identified. Normal proximal left subclavian artery dominant left vertebral artery origin, and cervical left vertebral artery. CTA HEAD Posterior circulation: Left vertebral artery supplies the basilar. Diminutive right V4 segment terminates in PICA. No distal vertebral or basilar stenosis. Patent SCA and PCA origins. Fetal type right PCA origin. Left posterior communicating artery diminutive or absent. Bilateral PCA branches are within normal limits. Anterior circulation: Both ICA siphons are patent. No siphon plaque or stenosis identified. Right siphon appears somewhat dominant. Normal right posterior communicating artery origin. Patent carotid termini, MCA and ACA origins. Mildly dominant right ACA A1. Normal anterior communicating artery. Bilateral ACA branches are tortuous but otherwise within normal limits. Left MCA M1 segment, bifurcation, and left MCA branches are within normal limits. Right MCA M1 segment and bifurcation are patent without stenosis. Right MCA branches are within normal limits. Venous sinuses: Early contrast timing but grossly patent. Anatomic variants: Dominant left vertebral artery supplies the basilar. Diminutive right vertebral terminates in PICA. Fetal type right PCA origin and mildly dominant right ACA A1. Review of the MIP images confirms the above findings IMPRESSION: 1. Normal CTA Head and Neck, with normal anatomic variation of the anterior and posterior circulation. 2. Small right frontal lobe posterior cortical infarct is not apparent by CT. No new  intracranial abnormality. Electronically Signed   By: Odessa Fleming M.D.   On: 09/05/2021 07:43   MR ANGIO HEAD WO CONTRAST  Result Date: 09/05/2021 CLINICAL DATA:  Initial evaluation for neuro deficit, stroke suspected, left-sided weakness and numbness. EXAM: MRI HEAD WITHOUT CONTRAST MRA HEAD WITHOUT CONTRAST MRA NECK WITHOUT AND WITH CONTRAST TECHNIQUE: Multiplanar, multi-echo pulse sequences of the brain and surrounding structures were acquired without intravenous contrast. Angiographic images of the Circle of Willis were acquired using MRA technique without intravenous contrast. Angiographic images of the neck were acquired using MRA technique without and with intravenous contrast. Carotid stenosis measurements (when applicable) are obtained utilizing NASCET criteria, using the distal internal carotid diameter as the denominator. CONTRAST:  10 cc of Gadavist. COMPARISON:  Prior head CT from 09/04/2021. FINDINGS: MRI HEAD FINDINGS Brain: Cerebral volume within normal limits. No significant cerebral white matter disease for age. 6 mm focus of restricted diffusion seen involving the cortical gray matter of the right parietal lobe, postcentral gyrus, consistent with an acute ischemic infarct (series 5, image 88). No associated hemorrhage or mass effect. No other evidence for acute or subacute ischemia. Gray-white matter differentiation otherwise maintained. No other areas of encephalomalacia to suggest chronic cortical infarction. No other acute or chronic intracranial blood products. No mass lesion, midline shift or mass effect no hydrocephalus or extra-axial fluid collection. Pituitary gland suprasellar region normal. Midline structures intact. Vascular: Major intracranial vascular flow voids are maintained. Skull and upper cervical spine: Craniocervical junction within normal limits. Bone marrow signal intensity normal. No scalp soft tissue abnormality. Sinuses/Orbits: Globes and orbital soft tissues within normal  limits. Moderate mucosal thickening noted throughout the ethmoidal air cells and maxillary sinuses. Prominent right mastoid effusion. Visualized nasopharynx unremarkable. Other: None. MRA HEAD FINDINGS Anterior circulation: Visualized distal cervical segments of the internal carotid arteries are widely patent with antegrade flow. Petrous, cavernous, and supraclinoid segments patent without stenosis or other abnormality. A1 segments patent. Normal anterior communicating artery complex. Anterior cerebral arteries widely patent without stenosis. No M1 stenosis or occlusion. Normal MCA bifurcations. Distal MCA branches well perfused and symmetric. Posterior circulation: Left vertebral artery dominant and widely patent. Partially visualized right vertebral artery hypoplastic and terminates in PICA. Both PICA patent. Basilar widely patent to its distal aspect without stenosis. Superior cerebellar arteries patent bilaterally. Left PCA supplied via the basilar. Right PCA supplied via the basilar as well as a robust right posterior communicating artery. Both PCAs well perfused or distal aspects. Anatomic variants: Hypoplastic right vertebral artery terminates in PICA. No aneurysm. MRA NECK FINDINGS Aortic arch: Visualized aortic arch normal caliber with normal branch pattern. No stenosis or other abnormality about the origin the great vessels. Right carotid system: Right common and internal carotid arteries patent without stenosis or evidence for dissection. No atheromatous narrowing or irregularity about the right carotid bulb. Left carotid system: Left common and internal carotid arteries patent without stenosis or evidence for dissection. No atheromatous irregularity or narrowing about the left carotid bulb. Vertebral arteries: Both vertebral arteries arise from the subclavian arteries. Left vertebral artery strongly dominant, with a diffusely hypoplastic right vertebral artery. Vertebral arteries patent without stenosis  or dissection. Hypoplastic right vertebral artery terminates in PICA. Other: None IMPRESSION: MRI HEAD IMPRESSION: 1. 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 2. Otherwise normal brain MRI. 3. Moderate paranasal sinus disease with associated right mastoid effusion. MRA HEAD IMPRESSION: Normal intracranial MRA. No large vessel occlusion, hemodynamically significant stenosis, or other acute vascular abnormality. MRA NECK IMPRESSION: 1. Normal MRA of the neck with wide patency of both carotid artery systems. 2. 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 Dominant left vertebral artery widely patent. Electronically Signed   By: Rise Mu M.D.   On: 09/05/2021 02:46   MR Angiogram Neck W or Wo Contrast  Result Date: 09/05/2021 CLINICAL DATA:  Initial evaluation for neuro deficit, stroke suspected, left-sided weakness and numbness. EXAM: MRI HEAD WITHOUT CONTRAST MRA HEAD WITHOUT CONTRAST MRA NECK WITHOUT AND WITH CONTRAST TECHNIQUE: Multiplanar, multi-echo pulse sequences of the brain and surrounding structures were acquired without intravenous contrast. Angiographic images of the Circle of Willis were acquired using MRA technique without intravenous contrast. Angiographic images of the neck were acquired using MRA technique without and with intravenous contrast. Carotid stenosis measurements (when applicable) are obtained utilizing NASCET criteria, using the distal internal carotid diameter as the denominator. CONTRAST:  10 cc of Gadavist. COMPARISON:  Prior head CT from 09/04/2021. FINDINGS: MRI HEAD FINDINGS Brain: Cerebral volume within normal limits. No significant cerebral white matter disease for age. 6 mm focus of restricted diffusion seen involving the cortical gray matter of the right parietal lobe, postcentral gyrus, consistent with an acute ischemic infarct (series 5, image 88). No associated hemorrhage or  mass effect. No other evidence for acute or subacute ischemia. Gray-white matter differentiation otherwise maintained. No other areas of encephalomalacia to suggest chronic cortical infarction. No other acute or chronic intracranial blood products. No mass lesion, midline shift or mass effect no hydrocephalus or extra-axial fluid collection. Pituitary gland suprasellar region normal. Midline structures intact.  Vascular: Major intracranial vascular flow voids are maintained. Skull and upper cervical spine: Craniocervical junction within normal limits. Bone marrow signal intensity normal. No scalp soft tissue abnormality. Sinuses/Orbits: Globes and orbital soft tissues within normal limits. Moderate mucosal thickening noted throughout the ethmoidal air cells and maxillary sinuses. Prominent right mastoid effusion. Visualized nasopharynx unremarkable. Other: None. MRA HEAD FINDINGS Anterior circulation: Visualized distal cervical segments of the internal carotid arteries are widely patent with antegrade flow. Petrous, cavernous, and supraclinoid segments patent without stenosis or other abnormality. A1 segments patent. Normal anterior communicating artery complex. Anterior cerebral arteries widely patent without stenosis. No M1 stenosis or occlusion. Normal MCA bifurcations. Distal MCA branches well perfused and symmetric. Posterior circulation: Left vertebral artery dominant and widely patent. Partially visualized right vertebral artery hypoplastic and terminates in PICA. Both PICA patent. Basilar widely patent to its distal aspect without stenosis. Superior cerebellar arteries patent bilaterally. Left PCA supplied via the basilar. Right PCA supplied via the basilar as well as a robust right posterior communicating artery. Both PCAs well perfused or distal aspects. Anatomic variants: Hypoplastic right vertebral artery terminates in PICA. No aneurysm. MRA NECK FINDINGS Aortic arch: Visualized aortic arch normal caliber  with normal branch pattern. No stenosis or other abnormality about the origin the great vessels. Right carotid system: Right common and internal carotid arteries patent without stenosis or evidence for dissection. No atheromatous narrowing or irregularity about the right carotid bulb. Left carotid system: Left common and internal carotid arteries patent without stenosis or evidence for dissection. No atheromatous irregularity or narrowing about the left carotid bulb. Vertebral arteries: Both vertebral arteries arise from the subclavian arteries. Left vertebral artery strongly dominant, with a diffusely hypoplastic right vertebral artery. Vertebral arteries patent without stenosis or dissection. Hypoplastic right vertebral artery terminates in PICA. Other: None IMPRESSION: MRI HEAD IMPRESSION: 1. 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 2. Otherwise normal brain MRI. 3. Moderate paranasal sinus disease with associated right mastoid effusion. MRA HEAD IMPRESSION: Normal intracranial MRA. No large vessel occlusion, hemodynamically significant stenosis, or other acute vascular abnormality. MRA NECK IMPRESSION: 1. Normal MRA of the neck with wide patency of both carotid artery systems. 2. 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 Dominant left vertebral artery widely patent. Electronically Signed   By: Rise Mu M.D.   On: 09/05/2021 02:46   MR BRAIN WO CONTRAST  Result Date: 09/05/2021 CLINICAL DATA:  Initial evaluation for neuro deficit, stroke suspected, left-sided weakness and numbness. EXAM: MRI HEAD WITHOUT CONTRAST MRA HEAD WITHOUT CONTRAST MRA NECK WITHOUT AND WITH CONTRAST TECHNIQUE: Multiplanar, multi-echo pulse sequences of the brain and surrounding structures were acquired without intravenous contrast. Angiographic images of the Circle of Willis were acquired using MRA technique without intravenous  contrast. Angiographic images of the neck were acquired using MRA technique without and with intravenous contrast. Carotid stenosis measurements (when applicable) are obtained utilizing NASCET criteria, using the distal internal carotid diameter as the denominator. CONTRAST:  10 cc of Gadavist. COMPARISON:  Prior head CT from 09/04/2021. FINDINGS: MRI HEAD FINDINGS Brain: Cerebral volume within normal limits. No significant cerebral white matter disease for age. 6 mm focus of restricted diffusion seen involving the cortical gray matter of the right parietal lobe, postcentral gyrus, consistent with an acute ischemic infarct (series 5, image 88). No associated hemorrhage or mass effect. No other evidence for acute or subacute ischemia. Gray-white matter differentiation otherwise maintained. No other areas of encephalomalacia to suggest  chronic cortical infarction. No other acute or chronic intracranial blood products. No mass lesion, midline shift or mass effect no hydrocephalus or extra-axial fluid collection. Pituitary gland suprasellar region normal. Midline structures intact. Vascular: Major intracranial vascular flow voids are maintained. Skull and upper cervical spine: Craniocervical junction within normal limits. Bone marrow signal intensity normal. No scalp soft tissue abnormality. Sinuses/Orbits: Globes and orbital soft tissues within normal limits. Moderate mucosal thickening noted throughout the ethmoidal air cells and maxillary sinuses. Prominent right mastoid effusion. Visualized nasopharynx unremarkable. Other: None. MRA HEAD FINDINGS Anterior circulation: Visualized distal cervical segments of the internal carotid arteries are widely patent with antegrade flow. Petrous, cavernous, and supraclinoid segments patent without stenosis or other abnormality. A1 segments patent. Normal anterior communicating artery complex. Anterior cerebral arteries widely patent without stenosis. No M1 stenosis or occlusion.  Normal MCA bifurcations. Distal MCA branches well perfused and symmetric. Posterior circulation: Left vertebral artery dominant and widely patent. Partially visualized right vertebral artery hypoplastic and terminates in PICA. Both PICA patent. Basilar widely patent to its distal aspect without stenosis. Superior cerebellar arteries patent bilaterally. Left PCA supplied via the basilar. Right PCA supplied via the basilar as well as a robust right posterior communicating artery. Both PCAs well perfused or distal aspects. Anatomic variants: Hypoplastic right vertebral artery terminates in PICA. No aneurysm. MRA NECK FINDINGS Aortic arch: Visualized aortic arch normal caliber with normal branch pattern. No stenosis or other abnormality about the origin the great vessels. Right carotid system: Right common and internal carotid arteries patent without stenosis or evidence for dissection. No atheromatous narrowing or irregularity about the right carotid bulb. Left carotid system: Left common and internal carotid arteries patent without stenosis or evidence for dissection. No atheromatous irregularity or narrowing about the left carotid bulb. Vertebral arteries: Both vertebral arteries arise from the subclavian arteries. Left vertebral artery strongly dominant, with a diffusely hypoplastic right vertebral artery. Vertebral arteries patent without stenosis or dissection. Hypoplastic right vertebral artery terminates in PICA. Other: None IMPRESSION: MRI HEAD IMPRESSION: 1. 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 2. Otherwise normal brain MRI. 3. Moderate paranasal sinus disease with associated right mastoid effusion. MRA HEAD IMPRESSION: Normal intracranial MRA. No large vessel occlusion, hemodynamically significant stenosis, or other acute vascular abnormality. MRA NECK IMPRESSION: 1. Normal MRA of the neck with wide patency of both carotid artery systems. 2.  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 Dominant left vertebral artery widely patent. Electronically Signed   By: Rise Mu M.D.   On: 09/05/2021 02:46   ECHOCARDIOGRAM COMPLETE BUBBLE STUDY  Result Date: 09/05/2021    ECHOCARDIOGRAM REPORT   Patient Name:   ETHEN BANNAN Date of Exam: 09/05/2021 Medical Rec #:  [id number]          Height:       69.0 in Accession #:    [id number]         Weight:       255.0 lb Date of Birth:  June 21, 1991          BSA:          2.291 m Patient Age:    30 years           BP:           137/77 mmHg Patient Gender: M                  HR:  85 bpm. Exam Location:  Inpatient Procedure: 2D Echo, Cardiac Doppler and Color Doppler Indications:    Stroke  History:        Patient has no prior history of Echocardiogram examinations.  Sonographer:    Ross Ludwig RDCS (AE) Referring Phys: [id number] [facility]  Sonographer Comments: Patient is morbidly obese. IMPRESSIONS  1. Left ventricular ejection fraction, by estimation, is 60 to 65%. The left ventricle has normal function. The left ventricle has no regional wall motion abnormalities. Left ventricular diastolic parameters were normal.  2. Right ventricular systolic function is normal. The right ventricular size is normal. There is normal pulmonary artery systolic pressure.  3. Left atrial size was mildly dilated.  4. The mitral valve is abnormal. Trivial mitral valve regurgitation. No evidence of mitral stenosis.  5. The aortic valve is normal in structure. Aortic valve regurgitation is not visualized. No aortic stenosis is present.  6. The inferior vena cava is normal in size with greater than 50% respiratory variability, suggesting right atrial pressure of 3 mmHg.  7. Agitated saline contrast bubble study was negative, with no evidence of any interatrial shunt. FINDINGS  Left Ventricle: Left ventricular ejection fraction, by estimation, is 60 to 65%. The left ventricle has normal function.  The left ventricle has no regional wall motion abnormalities. The left ventricular internal cavity size was normal in size. There is  no left ventricular hypertrophy. Left ventricular diastolic parameters were normal. Right Ventricle: The right ventricular size is normal. No increase in right ventricular wall thickness. Right ventricular systolic function is normal. There is normal pulmonary artery systolic pressure. The tricuspid regurgitant velocity is 2.09 m/s, and  with an assumed right atrial pressure of 3 mmHg, the estimated right ventricular systolic pressure is 20.5 mmHg. Left Atrium: Left atrial size was mildly dilated. Right Atrium: Right atrial size was normal in size. Pericardium: There is no evidence of pericardial effusion. Mitral Valve: The mitral valve is abnormal. There is mild thickening of the mitral valve leaflet(s). There is mild calcification of the mitral valve leaflet(s). Trivial mitral valve regurgitation. No evidence of mitral valve stenosis. Tricuspid Valve: The tricuspid valve is normal in structure. Tricuspid valve regurgitation is mild . No evidence of tricuspid stenosis. Aortic Valve: The aortic valve is normal in structure. Aortic valve regurgitation is not visualized. No aortic stenosis is present. Aortic valve mean gradient measures 3.0 mmHg. Aortic valve peak gradient measures 5.8 mmHg. Aortic valve area, by VTI measures 2.78 cm. Pulmonic Valve: The pulmonic valve was normal in structure. Pulmonic valve regurgitation is not visualized. No evidence of pulmonic stenosis. Aorta: The aortic root is normal in size and structure. Venous: The inferior vena cava is normal in size with greater than 50% respiratory variability, suggesting right atrial pressure of 3 mmHg. IAS/Shunts: No atrial level shunt detected by color flow Doppler. Agitated saline contrast bubble study was negative, with no evidence of any interatrial shunt.  LEFT VENTRICLE PLAX 2D LVIDd:         5.20 cm   Diastology  LVIDs:         3.20 cm   LV e' medial:    11.70 cm/s LV PW:         1.30 cm   LV E/e' medial:  9.2 LV IVS:        0.90 cm   LV e' lateral:   13.30 cm/s LVOT diam:     2.10 cm   LV E/e' lateral: 8.1 LV SV:  63 LV SV Index:   28 LVOT Area:     3.46 cm  RIGHT VENTRICLE             IVC RV Basal diam:  3.40 cm     IVC diam: 1.70 cm RV S prime:     13.10 cm/s TAPSE (M-mode): 2.2 cm LEFT ATRIUM           Index        RIGHT ATRIUM           Index LA diam:      4.00 cm 1.75 cm/m   RA Area:     13.40 cm LA Vol (A4C): 59.2 ml 25.84 ml/m  RA Volume:   29.50 ml  12.88 ml/m  AORTIC VALVE AV Area (Vmax):    2.77 cm AV Area (Vmean):   2.78 cm AV Area (VTI):     2.78 cm AV Vmax:           120.00 cm/s AV Vmean:          79.100 cm/s AV VTI:            0.227 m AV Peak Grad:      5.8 mmHg AV Mean Grad:      3.0 mmHg LVOT Vmax:         95.90 cm/s LVOT Vmean:        63.500 cm/s LVOT VTI:          0.182 m LVOT/AV VTI ratio: 0.80  AORTA Ao Root diam: 3.50 cm Ao Asc diam:  3.10 cm MITRAL VALVE                TRICUSPID VALVE MV Area (PHT): 4.96 cm     TR Peak grad:   17.5 mmHg MV Decel Time: 153 msec     TR Vmax:        209.00 cm/s MV E velocity: 108.00 cm/s MV A velocity: 75.80 cm/s   SHUNTS MV E/A ratio:  1.42         Systemic VTI:  0.18 m                             Systemic Diam: 2.10 cm Charlton Haws MD Electronically signed by Charlton Haws MD Signature Date/Time: 09/05/2021/11:49:48 AM    Final    VAS [place] LOWER EXTREMITY VENOUS (DVT)  Result Date: 09/05/2021  Lower Venous DVT Study Patient Name:  TACARI REPASS  Date of Exam:   09/05/2021 Medical Rec #: [id number]           Accession #:    [id number] Date of Birth: 04-25-1991           Patient Gender: M Patient Age:   52 years Exam Location:  [facility] Procedure:      VAS [place] LOWER EXTREMITY VENOUS (DVT) Referring Phys: MCNEILL KIRKPATRICK --------------------------------------------------------------------------------  Indications: Stroke.  Comparison Study:  No prior study on file Performing Technologist: Sherren Kerns RVS  Examination Guidelines: A complete evaluation includes B-mode imaging, spectral Doppler, color Doppler, and power Doppler as needed of all accessible portions of each vessel. Bilateral testing is considered an integral part of a complete examination. Limited examinations for reoccurring indications may be performed as noted. The reflux portion of the exam is performed with the patient in reverse Trendelenburg.  +---------+---------------+---------+-----------+----------+--------------+  RIGHT     Compressibility Phasicity Spontaneity Properties Thrombus Aging  +---------+---------------+---------+-----------+----------+--------------+  CFV  Full            Yes       Yes                                    +---------+---------------+---------+-----------+----------+--------------+  SFJ       Full                                                             +---------+---------------+---------+-----------+----------+--------------+  FV Prox   Full                                                             +---------+---------------+---------+-----------+----------+--------------+  FV Mid    Full                                                             +---------+---------------+---------+-----------+----------+--------------+  FV Distal Full                                                             +---------+---------------+---------+-----------+----------+--------------+  PFV       Full                                                             +---------+---------------+---------+-----------+----------+--------------+  POP       Full            Yes       Yes                                    +---------+---------------+---------+-----------+----------+--------------+  PTV       Full                                                             +---------+---------------+---------+-----------+----------+--------------+  PERO      Full                                                              +---------+---------------+---------+-----------+----------+--------------+   +---------+---------------+---------+-----------+----------+---------------+  LEFT      Compressibility Phasicity Spontaneity Properties Thrombus Aging   +---------+---------------+---------+-----------+----------+---------------+  CFV       Full            Yes       Yes                                     +---------+---------------+---------+-----------+----------+---------------+  SFJ       Full                                                              +---------+---------------+---------+-----------+----------+---------------+  FV Prox   Full                                                              +---------+---------------+---------+-----------+----------+---------------+  FV Mid    Full                                                              +---------+---------------+---------+-----------+----------+---------------+  FV Distal Full                                                              +---------+---------------+---------+-----------+----------+---------------+  PFV       Full                                                              +---------+---------------+---------+-----------+----------+---------------+  POP       Full            Yes       Yes                                     +---------+---------------+---------+-----------+----------+---------------+  PTV                                                        patent by color  +---------+---------------+---------+-----------+----------+---------------+  PERO  patent by color  +---------+---------------+---------+-----------+----------+---------------+     Summary: BILATERAL: -No evidence of popliteal cyst, bilaterally. RIGHT: - There is no evidence of deep vein thrombosis in the lower extremity.  LEFT: - There is no evidence of deep vein thrombosis  in the lower extremity.  *See table(s) above for measurements and observations. Electronically signed by Waverly Ferrari MD on 09/05/2021 at 11:24:42 AM.    Final

## 2021-09-05 NOTE — Consult Note (Signed)
Neurology Consultation Reason for Consult: Stroke Referring Physician: Mesner, J  CC: Stroke  History is obtained from: patient  HPI: Bobby Haynes is a 31 y.o. male with no significant past medical history who presents with transient right sided numbness.  He states that initially, he had blurring of his vision, and his left side felt numb.  This lasted for approximately 5 minutes, and then resolved.  He is currently completely back to baseline.  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  He denies any substances of abuse other than nicotine vape.  An MRI was done in the emergency department showing an acute embolic appearing stroke.    LKW: 1600 tpa given?: no, out of window    ROS: A 14 point ROS was performed and is negative except as noted in the HPI.   Past Medical History:  Diagnosis Date   Depression    GERD (gastroesophageal reflux disease)    Insomnia      Family History  Problem Relation Age of Onset   Dementia Maternal Grandmother    Dementia Maternal Grandfather      Social History:  reports that he has never smoked. He has never been exposed to tobacco smoke. He has never used smokeless tobacco. He reports current alcohol use. He reports that he does not use drugs.   Exam: Current vital signs: BP (!) 146/93    Pulse 87    Temp 97.9 F (36.6 C) (Oral)    Resp 19    SpO2 96%  Vital signs in last 24 hours: Temp:  [97.9 F (36.6 C)] 97.9 F (36.6 C) (01/21 1640) Pulse Rate:  [81-111] 87 (01/22 0045) Resp:  [15-20] 19 (01/22 0045) BP: (131-164)/(75-105) 146/93 (01/22 0045) SpO2:  [96 %-99 %] 96 % (01/22 0045)   Physical Exam  Constitutional: Appears well-developed and well-nourished.  Psych: Affect appropriate to situation Eyes: No scleral injection HENT: No OP obstruction MSK: no joint deformities.  Cardiovascular: Normal rate and regular rhythm.  Respiratory: Effort normal, non-labored  breathing GI: Soft.  No distension. There is no tenderness.  Skin: WDI  Neuro: Mental Status: Patient is awake, alert, oriented to person, place, month, year, and situation. Patient is able to give a clear and coherent history. No signs of aphasia or neglect.  Cranial Nerves: II: Visual Fields are full. Pupils are equal, round, and reactive to light.   III,IV, VI: EOMI without ptosis or diploplia.  V: Facial sensation is symmetric to temperature VII: Facial movement is symmetric.  VIII: hearing is intact to voice X: Uvula elevates symmetrically XI: Shoulder shrug is symmetric. XII: tongue is midline without atrophy or fasciculations.  Motor: Tone is normal. Bulk is normal. 5/5 strength was present in all four extremities.  Sensory: Sensation is symmetric to light touch and temperature in the arms and legs.  Cerebellar: FNF and HKS are intact bilaterally    I have reviewed labs in epic and the results pertinent to this consultation are: CMP - mildly elevated LFTs  I have reviewed the images obtained: MRI brain - small ischemic stroke in the right parietal lobe  Impression: 31 yo M with small embolic stroke in the setting of a bowel movement. This raises significant concern for a right to left shunt.  This does beg the question, however, of embolic source prior to shunting and therefore we will pursue hypercoagulable And lower extremity Dopplers.  Recommendations: - HgbA1c, fasting lipid panel -  MRI of the brain without contrast - Frequent neuro checks - Echocardiogram - CTA head and neck - Prophylactic therapy-Antiplatelet med: Aspirin - dose 81mg  and plavix 75mg  daily  after 300mg  load  - Risk factor modification - Telemetry monitoring - PT consult, OT consult, Speech consult - LE dopplers - hypercoagulable panel - Stroke team to follow    , MD [facility] [phone]  If 7pm- 7am, please page neurology on call as listed in  [facility].

## 2021-09-05 NOTE — ED Notes (Signed)
Pt lying semi fowlers in bed with family at bedside. NAD. A/ox4, speaking clearly in full sentences. Pt states that yesterday at approximately 1600 he had a sudden onset of left sided body weakness and facial droop, slurred speech, blurred vision while in the restroom. Witnessed by spouse, lasting approximately 5 minutes. Pt denies all current symptoms, NIHSS 0. VSS

## 2021-09-05 NOTE — Progress Notes (Signed)
° ° °  [facility] has been requested to perform a transesophageal echocardiogram on Bobby Haynes for stroke.  After careful review of history and examination, the risks and benefits of transesophageal echocardiogram have been explained including risks of esophageal damage, perforation (1:10,000 risk), bleeding, pharyngeal hematoma as well as other potential complications associated with conscious sedation including aspiration, arrhythmia, respiratory failure and death. Alternatives to treatment were discussed, questions were answered. Patient is willing to proceed.  ° °Hartman Minahan, PA-C  °09/05/2021 5:21 PM   °

## 2021-09-05 NOTE — H&P (Signed)
History and Physical    Bobby Haynes KN:[id number] DOB: 01/06/1991 DOA: 09/04/2021  PCP: [facility]  Patient coming from: Home  I have personally briefly reviewed patient's old medical records in [place]  Chief Complaint: Stroke  HPI: Bobby Haynes is a 31 y.o. male with medical history significant of obesity.  Pt presents to ED with transient left sided numbness.  Patient was having a BM and immediately after he had onset of L sided numbness, facial droop, slurred speech per wife.  Lasted 5 mins.  Resolved.  No h/o similar symptoms in past.  Does have h/o migraines, but never had neurologic deficits with migraines.  Was drinking EtOH last night.  No drug use.   ED Course: Pt with 58mm acute infarct in R postcentral gyrus on MRI.   Past Medical History:  Diagnosis Date   Depression    GERD (gastroesophageal reflux disease)    Insomnia     History reviewed. No pertinent surgical history.   reports that he has never smoked. He has never been exposed to tobacco smoke. He has never used smokeless tobacco. He reports current alcohol use. He reports that he does not use drugs.  No Known Allergies  Family History  Problem Relation Age of Onset   Dementia Maternal Grandmother    Dementia Maternal Grandfather     Prior to Admission medications   Medication Sig Start Date End Date Taking? Authorizing Provider  busPIRone (BUSPAR) 10 MG tablet Take 5 mg by mouth in the morning. 04/14/21  Yes [provider]  Cholecalciferol 25 MCG (1000 UT) tablet Take 1 tablet by mouth daily. 09/25/20  Yes [provider]  hydrOXYzine (ATARAX/VISTARIL) 10 MG tablet Take 10 mg by mouth daily as needed for anxiety. 04/14/21  Yes [provider]  magnesium 30 MG tablet Take 30 mg by mouth daily.   Yes [provider]  montelukast (SINGULAIR) 10 MG tablet Take 1 tablet by mouth daily. 03/01/21  Yes [provider]  Multiple  Vitamins-Minerals (MULTIVITAMIN ADULTS PO) Take 3 tablets by mouth daily.   Yes [provider]  Multiple Vitamins-Minerals (ZINC PO) Take 1 tablet by mouth daily.   Yes [provider]  rizatriptan (MAXALT-MLT) 10 MG disintegrating tablet Take 1 tablet earliest onset of migraine.  May repeat in 2 hours if needed.  Maximum 2 tablets in 24 hours. 06/02/21  Yes Jaffe, Adam R, DO  traZODone (DESYREL) 50 MG tablet Take 50 mg by mouth at bedtime. 04/14/21  Yes [provider]  albuterol (VENTOLIN HFA) 108 (90 Base) MCG/ACT inhaler INHALE 2 PUFFS BY MOUTH EVERY 6 HOURS AS NEEDED FOR WHEEZING 09/21/20   [provider]    Physical Exam: Vitals:   09/04/21 2300 09/04/21 2345 09/05/21 0045 09/05/21 0339  BP: (!) 142/90 137/80 (!) 146/93 (!) 146/85  Pulse: 83 81 87 78  Resp: 15 16 19 18   Temp:      TempSrc:      SpO2: 97% 96% 96% 97%    Constitutional: NAD, calm, comfortable Eyes: PERRL, lids and conjunctivae normal ENMT: Mucous membranes are moist. Posterior pharynx clear of any exudate or lesions.Normal dentition.  Neck: normal, supple, no masses, no thyromegaly Respiratory: clear to auscultation bilaterally, no wheezing, no crackles. Normal respiratory effort. No accessory muscle use.  Cardiovascular: Regular rate and rhythm, no murmurs / rubs / gallops. No extremity edema. 2+ pedal pulses. No carotid bruits.  Abdomen: no tenderness, no masses palpated. No hepatosplenomegaly. Bowel sounds  positive.  Musculoskeletal: no clubbing / cyanosis. No joint deformity upper and lower extremities. Good ROM, no contractures. Normal muscle tone.  Skin: no rashes, lesions, ulcers. No induration Neurologic: CN 2-12 grossly intact. Sensation intact, DTR normal. Strength 5/5 in all 4.  Psychiatric: Normal judgment and insight. Alert and oriented x 3. Normal mood.    Labs on Admission: I have personally reviewed following labs and imaging studies  CBC: Recent Labs  Lab  09/04/21 1650 09/04/21 1706  WBC 10.6*  --   NEUTROABS 6.3  --   HGB 15.0 15.3  HCT 45.6 45.0  MCV 89.8  --   PLT 395  --    Basic Metabolic Panel: Recent Labs  Lab 09/04/21 1650 09/04/21 1706  NA 141 141  K 3.6 3.5  CL 104 105  CO2 23  --   GLUCOSE 97 101*  BUN 10 10  CREATININE 1.12 0.90  CALCIUM 9.4  --    GFR: CrCl cannot be calculated (Unknown ideal weight.). Liver Function Tests: Recent Labs  Lab 09/04/21 1650  AST 52*  ALT 56*  ALKPHOS 72  BILITOT 0.5  PROT 7.6  ALBUMIN 4.4   No results for input(s): LIPASE, AMYLASE in the last 168 hours. No results for input(s): AMMONIA in the last 168 hours. Coagulation Profile: Recent Labs  Lab 09/04/21 1650  INR 0.9   Cardiac Enzymes: No results for input(s): CKTOTAL, CKMB, CKMBINDEX, TROPONINI in the last 168 hours. BNP (last 3 results) No results for input(s): PROBNP in the last 8760 hours. HbA1C: No results for input(s): HGBA1C in the last 72 hours. CBG: No results for input(s): GLUCAP in the last 168 hours. Lipid Profile: No results for input(s): CHOL, HDL, LDLCALC, TRIG, CHOLHDL, LDLDIRECT in the last 72 hours. Thyroid Function Tests: No results for input(s): TSH, T4TOTAL, FREET4, T3FREE, THYROIDAB in the last 72 hours. Anemia Panel: No results for input(s): VITAMINB12, FOLATE, FERRITIN, TIBC, IRON, RETICCTPCT in the last 72 hours. Urine analysis: No results found for: COLORURINE, APPEARANCEUR, LABSPEC, [place], GLUCOSEU, [place], BILIRUBINUR, KETONESUR, PROTEINUR, UROBILINOGEN, NITRITE, LEUKOCYTESUR  Radiological Exams on Admission: CT HEAD WO CONTRAST  Result Date: 09/04/2021 CLINICAL DATA:  Numbness or tingling, paresthesia (Ped 0-17y). Left-sided weakness, numbness EXAM: CT HEAD WITHOUT CONTRAST TECHNIQUE: Contiguous axial images were obtained from the base of the skull through the vertex without intravenous contrast. RADIATION DOSE REDUCTION: This exam was performed according to the departmental  dose-optimization program which includes automated exposure control, adjustment of the mA and/or kV according to patient size and/or use of iterative reconstruction technique. COMPARISON:  None. FINDINGS: Brain: No acute intracranial abnormality. Specifically, no hemorrhage, hydrocephalus, mass lesion, acute infarction, or significant intracranial injury. Vascular: No hyperdense vessel or unexpected calcification. Skull: No acute calvarial abnormality. Sinuses/Orbits: No acute findings. Mucosal thickening in the maxillary sinuses. Other: None IMPRESSION: No acute intracranial abnormality. Chronic maxillary sinusitis. Electronically Signed   By: Rolm Baptise M.D.   On: 09/04/2021 17:49   MR ANGIO HEAD WO CONTRAST  Result Date: 09/05/2021 CLINICAL DATA:  Initial evaluation for neuro deficit, stroke suspected, left-sided weakness and numbness. EXAM: MRI HEAD WITHOUT CONTRAST MRA HEAD WITHOUT CONTRAST MRA NECK WITHOUT AND WITH CONTRAST TECHNIQUE: Multiplanar, multi-echo pulse sequences of the brain and surrounding structures were acquired without intravenous contrast. Angiographic images of the Circle of Willis were acquired using MRA technique without intravenous contrast. Angiographic images of the neck were acquired using MRA technique without and with intravenous contrast. Carotid stenosis measurements (when applicable) are obtained utilizing  NASCET criteria, using the distal internal carotid diameter as the denominator. CONTRAST:  10 cc of Gadavist. COMPARISON:  Prior head CT from 09/04/2021. FINDINGS: MRI HEAD FINDINGS Brain: Cerebral volume within normal limits. No significant cerebral white matter disease for age. 6 mm focus of restricted diffusion seen involving the cortical gray matter of the right parietal lobe, postcentral gyrus, consistent with an acute ischemic infarct (series 5, image 88). No associated hemorrhage or mass effect. No other evidence for acute or subacute ischemia. Gray-white matter  differentiation otherwise maintained. No other areas of encephalomalacia to suggest chronic cortical infarction. No other acute or chronic intracranial blood products. No mass lesion, midline shift or mass effect no hydrocephalus or extra-axial fluid collection. Pituitary gland suprasellar region normal. Midline structures intact. Vascular: Major intracranial vascular flow voids are maintained. Skull and upper cervical spine: Craniocervical junction within normal limits. Bone marrow signal intensity normal. No scalp soft tissue abnormality. Sinuses/Orbits: Globes and orbital soft tissues within normal limits. Moderate mucosal thickening noted throughout the ethmoidal air cells and maxillary sinuses. Prominent right mastoid effusion. Visualized nasopharynx unremarkable. Other: None. MRA HEAD FINDINGS Anterior circulation: Visualized distal cervical segments of the internal carotid arteries are widely patent with antegrade flow. Petrous, cavernous, and supraclinoid segments patent without stenosis or other abnormality. A1 segments patent. Normal anterior communicating artery complex. Anterior cerebral arteries widely patent without stenosis. No M1 stenosis or occlusion. Normal MCA bifurcations. Distal MCA branches well perfused and symmetric. Posterior circulation: Left vertebral artery dominant and widely patent. Partially visualized right vertebral artery hypoplastic and terminates in PICA. Both PICA patent. Basilar widely patent to its distal aspect without stenosis. Superior cerebellar arteries patent bilaterally. Left PCA supplied via the basilar. Right PCA supplied via the basilar as well as a robust right posterior communicating artery. Both PCAs well perfused or distal aspects. Anatomic variants: Hypoplastic right vertebral artery terminates in PICA. No aneurysm. MRA NECK FINDINGS Aortic arch: Visualized aortic arch normal caliber with normal branch pattern. No stenosis or other abnormality about the origin  the great vessels. Right carotid system: Right common and internal carotid arteries patent without stenosis or evidence for dissection. No atheromatous narrowing or irregularity about the right carotid bulb. Left carotid system: Left common and internal carotid arteries patent without stenosis or evidence for dissection. No atheromatous irregularity or narrowing about the left carotid bulb. Vertebral arteries: Both vertebral arteries arise from the subclavian arteries. Left vertebral artery strongly dominant, with a diffusely hypoplastic right vertebral artery. Vertebral arteries patent without stenosis or dissection. Hypoplastic right vertebral artery terminates in PICA. Other: None IMPRESSION: MRI HEAD IMPRESSION: 1. 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 2. Otherwise normal brain MRI. 3. Moderate paranasal sinus disease with associated right mastoid effusion. MRA HEAD IMPRESSION: Normal intracranial MRA. No large vessel occlusion, hemodynamically significant stenosis, or other acute vascular abnormality. MRA NECK IMPRESSION: 1. Normal MRA of the neck with wide patency of both carotid artery systems. 2. 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 Dominant left vertebral artery widely patent. Electronically Signed   By: Jeannine Boga M.D.   On: 09/05/2021 02:46   MR Angiogram Neck W or Wo Contrast  Result Date: 09/05/2021 CLINICAL DATA:  Initial evaluation for neuro deficit, stroke suspected, left-sided weakness and numbness. EXAM: MRI HEAD WITHOUT CONTRAST MRA HEAD WITHOUT CONTRAST MRA NECK WITHOUT AND WITH CONTRAST TECHNIQUE: Multiplanar, multi-echo pulse sequences of the brain and surrounding structures were acquired without intravenous contrast. Angiographic images  of the Circle of Willis were acquired using MRA technique without intravenous contrast. Angiographic images of the neck were acquired using MRA  technique without and with intravenous contrast. Carotid stenosis measurements (when applicable) are obtained utilizing NASCET criteria, using the distal internal carotid diameter as the denominator. CONTRAST:  10 cc of Gadavist. COMPARISON:  Prior head CT from 09/04/2021. FINDINGS: MRI HEAD FINDINGS Brain: Cerebral volume within normal limits. No significant cerebral white matter disease for age. 6 mm focus of restricted diffusion seen involving the cortical gray matter of the right parietal lobe, postcentral gyrus, consistent with an acute ischemic infarct (series 5, image 88). No associated hemorrhage or mass effect. No other evidence for acute or subacute ischemia. Gray-white matter differentiation otherwise maintained. No other areas of encephalomalacia to suggest chronic cortical infarction. No other acute or chronic intracranial blood products. No mass lesion, midline shift or mass effect no hydrocephalus or extra-axial fluid collection. Pituitary gland suprasellar region normal. Midline structures intact. Vascular: Major intracranial vascular flow voids are maintained. Skull and upper cervical spine: Craniocervical junction within normal limits. Bone marrow signal intensity normal. No scalp soft tissue abnormality. Sinuses/Orbits: Globes and orbital soft tissues within normal limits. Moderate mucosal thickening noted throughout the ethmoidal air cells and maxillary sinuses. Prominent right mastoid effusion. Visualized nasopharynx unremarkable. Other: None. MRA HEAD FINDINGS Anterior circulation: Visualized distal cervical segments of the internal carotid arteries are widely patent with antegrade flow. Petrous, cavernous, and supraclinoid segments patent without stenosis or other abnormality. A1 segments patent. Normal anterior communicating artery complex. Anterior cerebral arteries widely patent without stenosis. No M1 stenosis or occlusion. Normal MCA bifurcations. Distal MCA branches well perfused and  symmetric. Posterior circulation: Left vertebral artery dominant and widely patent. Partially visualized right vertebral artery hypoplastic and terminates in PICA. Both PICA patent. Basilar widely patent to its distal aspect without stenosis. Superior cerebellar arteries patent bilaterally. Left PCA supplied via the basilar. Right PCA supplied via the basilar as well as a robust right posterior communicating artery. Both PCAs well perfused or distal aspects. Anatomic variants: Hypoplastic right vertebral artery terminates in PICA. No aneurysm. MRA NECK FINDINGS Aortic arch: Visualized aortic arch normal caliber with normal branch pattern. No stenosis or other abnormality about the origin the great vessels. Right carotid system: Right common and internal carotid arteries patent without stenosis or evidence for dissection. No atheromatous narrowing or irregularity about the right carotid bulb. Left carotid system: Left common and internal carotid arteries patent without stenosis or evidence for dissection. No atheromatous irregularity or narrowing about the left carotid bulb. Vertebral arteries: Both vertebral arteries arise from the subclavian arteries. Left vertebral artery strongly dominant, with a diffusely hypoplastic right vertebral artery. Vertebral arteries patent without stenosis or dissection. Hypoplastic right vertebral artery terminates in PICA. Other: None IMPRESSION: MRI HEAD IMPRESSION: 1. 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 2. Otherwise normal brain MRI. 3. Moderate paranasal sinus disease with associated right mastoid effusion. MRA HEAD IMPRESSION: Normal intracranial MRA. No large vessel occlusion, hemodynamically significant stenosis, or other acute vascular abnormality. MRA NECK IMPRESSION: 1. Normal MRA of the neck with wide patency of both carotid artery systems. 2. Right vertebral artery diffusely hypoplastic and terminates in  PICA. Dominant left vertebral artery widely patent. Electronically Signed   By: Jeannine Boga M.D.   On: 09/05/2021 02:46   MR BRAIN WO CONTRAST  Result Date: 09/05/2021 CLINICAL DATA:  Initial evaluation for neuro deficit, stroke suspected, left-sided weakness and  numbness. EXAM: MRI HEAD WITHOUT CONTRAST MRA HEAD WITHOUT CONTRAST MRA NECK WITHOUT AND WITH CONTRAST TECHNIQUE: Multiplanar, multi-echo pulse sequences of the brain and surrounding structures were acquired without intravenous contrast. Angiographic images of the Circle of Willis were acquired using MRA technique without intravenous contrast. Angiographic images of the neck were acquired using MRA technique without and with intravenous contrast. Carotid stenosis measurements (when applicable) are obtained utilizing NASCET criteria, using the distal internal carotid diameter as the denominator. CONTRAST:  10 cc of Gadavist. COMPARISON:  Prior head CT from 09/04/2021. FINDINGS: MRI HEAD FINDINGS Brain: Cerebral volume within normal limits. No significant cerebral white matter disease for age. 6 mm focus of restricted diffusion seen involving the cortical gray matter of the right parietal lobe, postcentral gyrus, consistent with an acute ischemic infarct (series 5, image 88). No associated hemorrhage or mass effect. No other evidence for acute or subacute ischemia. Gray-white matter differentiation otherwise maintained. No other areas of encephalomalacia to suggest chronic cortical infarction. No other acute or chronic intracranial blood products. No mass lesion, midline shift or mass effect no hydrocephalus or extra-axial fluid collection. Pituitary gland suprasellar region normal. Midline structures intact. Vascular: Major intracranial vascular flow voids are maintained. Skull and upper cervical spine: Craniocervical junction within normal limits. Bone marrow signal intensity normal. No scalp soft tissue abnormality. Sinuses/Orbits: Globes and  orbital soft tissues within normal limits. Moderate mucosal thickening noted throughout the ethmoidal air cells and maxillary sinuses. Prominent right mastoid effusion. Visualized nasopharynx unremarkable. Other: None. MRA HEAD FINDINGS Anterior circulation: Visualized distal cervical segments of the internal carotid arteries are widely patent with antegrade flow. Petrous, cavernous, and supraclinoid segments patent without stenosis or other abnormality. A1 segments patent. Normal anterior communicating artery complex. Anterior cerebral arteries widely patent without stenosis. No M1 stenosis or occlusion. Normal MCA bifurcations. Distal MCA branches well perfused and symmetric. Posterior circulation: Left vertebral artery dominant and widely patent. Partially visualized right vertebral artery hypoplastic and terminates in PICA. Both PICA patent. Basilar widely patent to its distal aspect without stenosis. Superior cerebellar arteries patent bilaterally. Left PCA supplied via the basilar. Right PCA supplied via the basilar as well as a robust right posterior communicating artery. Both PCAs well perfused or distal aspects. Anatomic variants: Hypoplastic right vertebral artery terminates in PICA. No aneurysm. MRA NECK FINDINGS Aortic arch: Visualized aortic arch normal caliber with normal branch pattern. No stenosis or other abnormality about the origin the great vessels. Right carotid system: Right common and internal carotid arteries patent without stenosis or evidence for dissection. No atheromatous narrowing or irregularity about the right carotid bulb. Left carotid system: Left common and internal carotid arteries patent without stenosis or evidence for dissection. No atheromatous irregularity or narrowing about the left carotid bulb. Vertebral arteries: Both vertebral arteries arise from the subclavian arteries. Left vertebral artery strongly dominant, with a diffusely hypoplastic right vertebral artery.  Vertebral arteries patent without stenosis or dissection. Hypoplastic right vertebral artery terminates in PICA. Other: None IMPRESSION: MRI HEAD IMPRESSION: 1. 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 2. Otherwise normal brain MRI. 3. Moderate paranasal sinus disease with associated right mastoid effusion. MRA HEAD IMPRESSION: Normal intracranial MRA. No large vessel occlusion, hemodynamically significant stenosis, or other acute vascular abnormality. MRA NECK IMPRESSION: 1. Normal MRA of the neck with wide patency of both carotid artery systems. 2. 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 Dominant left vertebral artery widely patent. Electronically Signed  By: Jeannine Boga M.D.   On: 09/05/2021 02:46    EKG: Independently reviewed.  Assessment/Plan Principal Problem:   Acute embolic stroke (HCC)    Acute embolic stroke - ? Cardio embolic from PFO and R to L shunt in setting of BM Would need embolic source prior to shunting though. See neuro consult note Stroke pathway CTA head and neck 2d echo - eval for PFO PT/OT/SLP Neuro checks Tele monitor ASA 81 + Plavix Embolic source work up: LE dopplers Hypercoag work up  DVT prophylaxis: Lovenox Code Status: Full Family Communication: Wife at bedside Disposition Plan: Home after stroke work up [employer] called: Neuro Admission status: Place in [place], [place] Hospitalists  How to contact the [facility] Attending or Consulting provider [place] or covering provider during after hours [place], for this patient?  Check the care team in [facility] and look for a) attending/consulting [facility] provider listed and b) the [facility] team listed Log into [email or web address]  Amion Physician Scheduling and messaging for groups and whole hospitals  On call and physician scheduling software for group practices, residents, hospitalists and other medical providers  for call, clinic, rotation and shift schedules. OnCall [employer] is a hospital-wide system for scheduling doctors and paging doctors on call. EasyPlot is for scientific plotting and data analysis.  [email or web address]  and use [place]'s universal password to access. If you do not have the password, please contact the hospital operator.  Locate the [facility] provider you are looking for under [facility] and page to a number that you can be directly reached. If you still have difficulty reaching the provider, please page the [facility] (Director on Call) for the Hospitalists listed on amion for assistance.  09/05/2021, 5:18 AM

## 2021-09-05 NOTE — ED Notes (Signed)
Pt transported to vascular at this time.

## 2021-09-06 ENCOUNTER — Observation Stay (HOSPITAL_COMMUNITY): Payer: No Typology Code available for payment source

## 2021-09-06 ENCOUNTER — Observation Stay (HOSPITAL_COMMUNITY): Payer: No Typology Code available for payment source | Admitting: Anesthesiology

## 2021-09-06 ENCOUNTER — Encounter (HOSPITAL_COMMUNITY): Payer: Self-pay | Admitting: Internal Medicine

## 2021-09-06 ENCOUNTER — Encounter (HOSPITAL_COMMUNITY)
Admission: EM | Disposition: A | Discharge status: Home / Self Care | Payer: Self-pay | Source: Home / Self Care | Attending: Internal Medicine

## 2021-09-06 DIAGNOSIS — Q2112 Patent foramen ovale: Secondary | ICD-10-CM

## 2021-09-06 DIAGNOSIS — R29810 Facial weakness: Secondary | ICD-10-CM | POA: Diagnosis present

## 2021-09-06 DIAGNOSIS — Z820 Family history of epilepsy and other diseases of the nervous system: Secondary | ICD-10-CM | POA: Diagnosis not present

## 2021-09-06 DIAGNOSIS — I6340 Cerebral infarction due to embolism of unspecified cerebral artery: Secondary | ICD-10-CM | POA: Diagnosis present

## 2021-09-06 DIAGNOSIS — F32A Depression, unspecified: Secondary | ICD-10-CM | POA: Diagnosis present

## 2021-09-06 DIAGNOSIS — Z79899 Other long term (current) drug therapy: Secondary | ICD-10-CM | POA: Diagnosis not present

## 2021-09-06 DIAGNOSIS — G4700 Insomnia, unspecified: Secondary | ICD-10-CM | POA: Diagnosis present

## 2021-09-06 DIAGNOSIS — Z20822 Contact with and (suspected) exposure to covid-19: Secondary | ICD-10-CM | POA: Diagnosis present

## 2021-09-06 DIAGNOSIS — E669 Obesity, unspecified: Secondary | ICD-10-CM

## 2021-09-06 DIAGNOSIS — I639 Cerebral infarction, unspecified: Secondary | ICD-10-CM

## 2021-09-06 DIAGNOSIS — G8194 Hemiplegia, unspecified affecting left nondominant side: Secondary | ICD-10-CM | POA: Diagnosis present

## 2021-09-06 DIAGNOSIS — R29700 NIHSS score 0: Secondary | ICD-10-CM | POA: Diagnosis present

## 2021-09-06 DIAGNOSIS — E785 Hyperlipidemia, unspecified: Secondary | ICD-10-CM | POA: Diagnosis present

## 2021-09-06 DIAGNOSIS — K219 Gastro-esophageal reflux disease without esophagitis: Secondary | ICD-10-CM | POA: Diagnosis present

## 2021-09-06 DIAGNOSIS — E7800 Pure hypercholesterolemia, unspecified: Secondary | ICD-10-CM | POA: Diagnosis not present

## 2021-09-06 DIAGNOSIS — G43109 Migraine with aura, not intractable, without status migrainosus: Secondary | ICD-10-CM | POA: Diagnosis present

## 2021-09-06 HISTORY — PX: BUBBLE STUDY: SHX6837

## 2021-09-06 HISTORY — PX: TEE WITHOUT CARDIOVERSION: SHX5443

## 2021-09-06 LAB — COMPREHENSIVE METABOLIC PANEL
ALT: 47 U/L — ABNORMAL HIGH (ref 0–44)
AST: 28 U/L (ref 15–41)
Albumin: 3.9 g/dL (ref 3.5–5.0)
Alkaline Phosphatase: 73 U/L (ref 38–126)
Anion gap: 8 (ref 5–15)
BUN: 13 mg/dL (ref 6–20)
CO2: 23 mmol/L (ref 22–32)
Calcium: 9.2 mg/dL (ref 8.9–10.3)
Chloride: 105 mmol/L (ref 98–111)
Creatinine, Ser: 1.03 mg/dL (ref 0.61–1.24)
GFR, Estimated: >60 mL/min (ref >60)
Glucose, Bld: 115 mg/dL — ABNORMAL HIGH (ref 70–99)
Potassium: 3.9 mmol/L (ref 3.5–5.1)
Sodium: 136 mmol/L (ref 135–145)
Total Bilirubin: 0.6 mg/dL (ref 0.3–1.2)
Total Protein: 7 g/dL (ref 6.5–8.1)

## 2021-09-06 LAB — CBC WITH DIFFERENTIAL/PLATELET
Abs Immature Granulocytes: 0.03 10*3/uL (ref 0.00–0.07)
Basophils Absolute: 0 10*3/uL (ref 0.0–0.1)
Basophils Relative: 0 %
Eosinophils Absolute: 0.4 10*3/uL (ref 0.0–0.5)
Eosinophils Relative: 4 %
HCT: 43.5 % (ref 39.0–52.0)
Hemoglobin: 15 g/dL (ref 13.0–17.0)
Immature Granulocytes: 0 %
Lymphocytes Relative: 25 %
Lymphs Abs: 2.4 10*3/uL (ref 0.7–4.0)
MCH: 30.5 pg (ref 26.0–34.0)
MCHC: 34.5 g/dL (ref 30.0–36.0)
MCV: 88.4 fL (ref 80.0–100.0)
Monocytes Absolute: 0.6 10*3/uL (ref 0.1–1.0)
Monocytes Relative: 6 %
Neutro Abs: 6.3 10*3/uL (ref 1.7–7.7)
Neutrophils Relative %: 65 %
Platelets: 321 10*3/uL (ref 150–400)
RBC: 4.92 MIL/uL (ref 4.22–5.81)
RDW: 12.1 % (ref 11.5–15.5)
WBC: 9.7 10*3/uL (ref 4.0–10.5)
nRBC: 0 % (ref 0.0–0.2)

## 2021-09-06 LAB — MAGNESIUM: Magnesium: 2 mg/dL (ref 1.7–2.4)

## 2021-09-06 LAB — LUPUS ANTICOAGULANT PANEL
DRVVT: 46.6 s (ref 0.0–47.0)
PTT Lupus Anticoagulant: 37.5 s (ref 0.0–51.9)

## 2021-09-06 LAB — PROTEIN S ACTIVITY: Protein S Activity: 130 % (ref 63–140)

## 2021-09-06 LAB — HOMOCYSTEINE: Homocysteine: 9.1 umol/L (ref 0.0–14.5)

## 2021-09-06 LAB — HEMOGLOBIN A1C
Hgb A1c MFr Bld: 5.8 % — ABNORMAL HIGH (ref 4.8–5.6)
Mean Plasma Glucose: 120 mg/dL

## 2021-09-06 LAB — PROTEIN S, TOTAL: Protein S Ag, Total: 119 % (ref 60–150)

## 2021-09-06 LAB — BRAIN NATRIURETIC PEPTIDE: B Natriuretic Peptide: 15.7 pg/mL (ref 0.0–100.0)

## 2021-09-06 LAB — PROTEIN C ACTIVITY: Protein C Activity: 153 % (ref 73–180)

## 2021-09-06 SURGERY — ECHOCARDIOGRAM, TRANSESOPHAGEAL
Anesthesia: Monitor Anesthesia Care

## 2021-09-06 MED ORDER — LIDOCAINE HCL (CARDIAC) PF 100 MG/5ML IV SOSY
PREFILLED_SYRINGE | INTRAVENOUS | Status: DC | PRN
  Administered 2021-09-06: 100 mg via INTRAVENOUS

## 2021-09-06 MED ORDER — SODIUM CHLORIDE 0.9 % IV SOLN
INTRAVENOUS | Status: AC | PRN
  Administered 2021-09-06: 500 mL via INTRAVENOUS

## 2021-09-06 MED ORDER — BUTAMBEN-TETRACAINE-BENZOCAINE 2-2-14 % EX AERO
INHALATION_SPRAY | CUTANEOUS | Status: DC | PRN
  Administered 2021-09-06: 2 via TOPICAL

## 2021-09-06 MED ORDER — PROPOFOL 500 MG/50ML IV EMUL
INTRAVENOUS | Status: DC | PRN
  Administered 2021-09-06: 150 ug/kg/min via INTRAVENOUS

## 2021-09-06 MED ORDER — MONTELUKAST SODIUM 10 MG PO TABS
10.0000 mg | ORAL_TABLET | Freq: Every day | ORAL | Status: DC
  Administered 2021-09-06 (×2): 10 mg via ORAL
  Filled 2021-09-06 (×2): qty 1

## 2021-09-06 MED ORDER — KETAMINE HCL 50 MG/5ML IJ SOSY
PREFILLED_SYRINGE | INTRAMUSCULAR | Status: AC
  Filled 2021-09-06: qty 5

## 2021-09-06 MED ORDER — PROPOFOL 10 MG/ML IV BOLUS
INTRAVENOUS | Status: DC | PRN
Start: 2021-09-06 — End: 2021-09-06
  Administered 2021-09-06: 30 mg via INTRAVENOUS
  Administered 2021-09-06: 50 mg via INTRAVENOUS

## 2021-09-06 MED ORDER — SODIUM CHLORIDE 0.9 % IV SOLN: INTRAVENOUS | Status: DC | PRN

## 2021-09-06 NOTE — CV Procedure (Signed)
° °  Transesophageal Echocardiogram  Indications:CVA,  Pt presented to ED with transient left sided numbness.  Patient was having a BM and immediately after he had onset of L sided numbness, facial droop, slurred speech per wife.  Time out performed  During this procedure the patient was administered propofol under anesthesiology supervision to achieve and maintain moderate sedation.  The patient's heart rate, blood pressure, and oxygen saturation are monitored continuously during the procedure.   Findings:  Left Ventricle: Normal EF 60%  Mitral Valve: Trace mitral regurgitation  Aortic Valve: Normal  Tricuspid Valve: Mild tricuspid regurgitation  Left Atrium: Normal, no left atrial appendage thrombus  Right Atrium: Normal  Intraatrial septum: PFO positive color Doppler  Bubble Contrast Study: Early positive bubble crossover.  PFO present.  Impression: PFO.  In the setting of stroke, consider PFO closure.  Donato Schultz, MD

## 2021-09-06 NOTE — Anesthesia Postprocedure Evaluation (Signed)
Anesthesia Post Note  Patient: Bobby Haynes  Procedure(s) Performed: TRANSESOPHAGEAL ECHOCARDIOGRAM (TEE) BUBBLE STUDY     Patient location during evaluation: PACU Anesthesia Type: MAC Level of consciousness: awake and alert Pain management: pain level controlled Vital Signs Assessment: post-procedure vital signs reviewed and stable Respiratory status: spontaneous breathing, nonlabored ventilation, respiratory function stable and patient connected to nasal cannula oxygen Cardiovascular status: stable and blood pressure returned to baseline Postop Assessment: no apparent nausea or vomiting Anesthetic complications: no   No notable events documented.  Last Vitals:  Vitals:   09/06/21 1110 09/06/21 1231  BP: 118/83 121/73  Pulse: 79 78  Resp: 14 14  Temp: 36.6 C 36.9 C  SpO2: 95% 97%    Last Pain:  Vitals:   09/06/21 1231  TempSrc: Oral  PainSc:                  Ople Girgis L Irfan Veal

## 2021-09-06 NOTE — Discharge Instructions (Addendum)
Follow with [facility] in [facility] days, also follow with the recommended neurologist and cardiologist within 2 weeks of discharge.  Get CBC, CMP, 2 view Chest X ray -  checked next visit within 1 week by Primary MD   Activity: As tolerated with Full fall precautions use walker/cane & assistance as needed, do not overexert or strain till your PFO is closed, no weightlifting until PFO is closed  Disposition Home    Diet: Heart Healthy   Special Instructions: If you have smoked or chewed Tobacco  in the last 2 yrs please stop smoking, stop any regular Alcohol  and or any Recreational drug use.  On your next visit with your primary care physician please Get Medicines reviewed and adjusted.  Please request your Prim.MD to go over all Hospital Tests and Procedure/Radiological results at the follow up, please get all Hospital records sent to your Prim MD by signing hospital release before you go home.  If you experience worsening of your admission symptoms, develop shortness of breath, life threatening emergency, suicidal or homicidal thoughts you must seek medical attention immediately by calling 911 or calling your MD immediately  if symptoms less severe.  You Must read complete instructions/literature along with all the possible adverse reactions/side effects for all the Medicines you take and that have been prescribed to you. Take any new Medicines after you have completely understood and accpet all the possible adverse reactions/side effects.

## 2021-09-06 NOTE — Interval H&P Note (Signed)
History and Physical Interval Note:  09/06/2021 10:10 AM  Bobby Haynes  has presented today for surgery, with the diagnosis of stroke.  The various methods of treatment have been discussed with the patient and family. After consideration of risks, benefits and other options for treatment, the patient has consented to  Procedure(s): TRANSESOPHAGEAL ECHOCARDIOGRAM (TEE) (N/A) as a surgical intervention.  The patient's history has been reviewed, patient examined, no change in status, stable for surgery.  I have reviewed the patient's chart and labs.  Questions were answered to the patient's satisfaction.     [employer]

## 2021-09-06 NOTE — Transfer of Care (Signed)
Immediate Anesthesia Transfer of Care Note  Patient: Bobby Haynes  Procedure(s) Performed: TRANSESOPHAGEAL ECHOCARDIOGRAM (TEE) BUBBLE STUDY  Patient Location: PACU  Anesthesia Type:MAC  Level of Consciousness: awake and patient cooperative  Airway & Oxygen Therapy: Patient Spontanous Breathing  Post-op Assessment: Report given to RN and Post -op Vital signs reviewed and stable  Post vital signs: Reviewed and stable  Last Vitals:  Vitals Value Taken Time  BP 135/94 09/06/21 1055  Temp    Pulse 100 09/06/21 1059  Resp 13 09/06/21 1059  SpO2 96 % 09/06/21 1059  Vitals shown include unvalidated device data.  Last Pain:  Vitals:   09/06/21 1000  TempSrc: Oral  PainSc:          Complications: No notable events documented.

## 2021-09-06 NOTE — Evaluation (Signed)
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 No pertinent surgical history. HPI:  Pt is a 31 y.o. male who presented 09/04/21 with L sided numbness, facial droop, and slurred speech that lasted 5 minutes. 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 PMH: obesity, migraines, depression, GERD   Assessment / Plan / Recommendation Clinical Impression  Pt participated in speech/language/cognition evaluation evaluation with his wife present. Both parties denied any baseline or current acute changes in these areas. 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 [facility] Mental Status Examination was completed to evaluate the pt's cognitive-linguistic skills. He achieved a score of 28/30 which is within the normal limits of 27 or more out of 30. No speech or language deficits were noted and his performance on informal cognitive-linguistic tasks was within normal limits. Further skilled SLP services are not clinically indicated at this time. Pt and his wife were educated regarding this and both parties verbalized understanding as well as agreement with plan of care.    SLP Assessment  SLP Recommendation/Assessment: Patient does not need any further Speech Lanaguage Pathology Services SLP Visit Diagnosis: Cognitive communication deficit (R41.841)    Recommendations for follow up therapy are one component of a multi-disciplinary discharge planning process,  led by the attending physician.  Recommendations may be updated based on patient status, additional functional criteria and insurance authorization.    Follow Up Recommendations  No SLP follow up    Assistance Recommended at Discharge  None  Functional Status Assessment Patient has not had a recent decline in their functional status  Frequency and Duration           SLP Evaluation Cognition  Overall Cognitive Status: Within Functional Limits for tasks assessed Arousal/Alertness: Awake/alert Orientation Level: Oriented X4 Year: 2023 Month: January Day of Week: Correct Attention: Focused;Sustained Focused Attention: Appears intact Sustained Attention: Appears intact Memory: Appears intact Awareness: Appears intact Problem Solving: Appears intact Executive Function: Reasoning;Sequencing;Organizing Reasoning: Appears intact       Comprehension  Auditory Comprehension Overall Auditory Comprehension: Appears within functional limits for tasks assessed Yes/No Questions: Within Functional Limits Commands: Within Functional Limits Conversation: Complex    Expression Expression Primary Mode of Expression: Verbal Verbal Expression Overall Verbal Expression: Appears within functional limits for tasks assessed Initiation: No impairment Level of Generative/Spontaneous Verbalization: Conversation Repetition: No impairment Naming: No impairment Pragmatics: No impairment   Oral / Motor  Oral Motor/Sensory Function Overall Oral Motor/Sensory Function: Within functional limits Motor Speech Overall Motor Speech: Appears within functional limits for tasks assessed Respiration: Within functional limits Phonation: Normal Resonance: Within functional limits Articulation: Within functional limitis Intelligibility: Intelligible Motor Planning: Witnin functional limits Motor Speech Errors: Not applicable           Bobby Haynes I. Vear Clock, MS, CCC-SLP Acute Rehabilitation Services Office  number [phone] Pager [phone]  Bobby Haynes 09/06/2021, 11:55 AM

## 2021-09-06 NOTE — Progress Notes (Addendum)
STROKE TEAM PROGRESS NOTE   INTERVAL HISTORY Wife at bedside. Patient reports he tolerated TEE well. Discussed results of TEE and finding of PFO. Discussed need for ASA+Plavix until patient sees Dr. Excell Seltzer for PFO closure. Patient agreeable to plan and all questions were at this time.    Vitals:   09/06/21 0816 09/06/21 1000 09/06/21 1055 09/06/21 1110  BP: 131/85 136/85 (!) 135/94 118/83  Pulse: 72 78 99 79  Resp: 16 14 18 14   Temp: 97.9 F (36.6 C) (!) 97.4 F (36.3 C) 97.9 F (36.6 C) 97.9 F (36.6 C)  TempSrc: Oral Oral    SpO2: 97% 99% 96% 95%  Weight:  113.4 kg    Height:  5\' 9"  (1.753 m)     CBC:  Recent Labs  Lab 09/04/21 1650 09/04/21 1706 09/06/21 0351  WBC 10.6*  --  9.7  NEUTROABS 6.3  --  6.3  HGB 15.0 15.3 15.0  HCT 45.6 45.0 43.5  MCV 89.8  --  88.4  PLT 395  --  321    Basic Metabolic Panel:  Recent Labs  Lab 09/04/21 1650 09/04/21 1706 09/06/21 0351  NA 141 141 136  K 3.6 3.5 3.9  CL 104 105 105  CO2 23  --  23  GLUCOSE 97 101* 115*  BUN 10 10 13   CREATININE 1.12 0.90 1.03  CALCIUM 9.4  --  9.2  MG  --   --  2.0    Lipid Panel:  Recent Labs  Lab 09/05/21 0635  CHOL 230*  TRIG 115  HDL 49  CHOLHDL 4.7  VLDL 23  LDLCALC 09/08/21*    HgbA1c:  Recent Labs  Lab 09/05/21 0635  HGBA1C 5.8*   Urine Drug Screen:  Recent Labs  Lab 09/05/21 0543  LABOPIA NONE DETECTED  COCAINSCRNUR NONE DETECTED  LABBENZ POSITIVE*  AMPHETMU NONE DETECTED  THCU NONE DETECTED  LABBARB NONE DETECTED     Alcohol Level No results for input(s): ETH in the last 168 hours.  IMAGING past 24 hours No results found.  PHYSICAL EXAM  Temp:  [97.4 F (36.3 C)-98.5 F (36.9 C)] 97.9 F (36.6 C) (01/23 1110) Pulse Rate:  [70-99] 79 (01/23 1110) Resp:  [14-18] 14 (01/23 1110) BP: (118-157)/(71-94) 118/83 (01/23 1110) SpO2:  [95 %-99 %] 95 % (01/23 1110) Weight:  [113.4 kg] 113.4 kg (01/23 1000)  General - Well nourished, well developed, young male in  no apparent distress.  Cardiovascular - Regular rhythm and rate.  Mental Status -  Level of arousal and orientation to time, place, and person were intact. Language including expression, naming, repetition, comprehension was assessed and found intact. Attention span and concentration were normal. Recent and remote memory were intact. Fund of Knowledge was assessed and was intact.  Cranial Nerves II - XII - II - Visual field intact OU. III, IV, VI - Extraocular movements intact. V - Facial sensation intact bilaterally. VII - Facial movement intact bilaterally. VIII - Hearing & vestibular intact bilaterally. X - Palate elevates symmetrically. XI - Chin turning & shoulder shrug intact bilaterally. XII - Tongue protrusion intact.  Motor Strength - The patients strength was normal in all extremities and pronator drift was absent.  Bulk was normal and fasciculations were absent.   Motor Tone - Muscle tone was assessed at the neck and appendages and was normal.  Reflexes - The patients reflexes were symmetrical in all extremities and he had no pathological reflexes.  Sensory - Light touch, temperature/pinprick were  assessed and were symmetrical.    Coordination - The patient had normal movements in the hands and feet with no ataxia or dysmetria.  Tremor was absent.  Gait and Station - deferred.   ASSESSMENT/PLAN Bobby Haynes is a 31 y.o. male with history of migraines with visual aura, obesity presenting with transient 5 minute episode of L sided numbness, weakness, dysarthria, and blurry vision.   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   MRI: 6 mm acute ischemic nonhemorrhagic cortical infarct involving the R parietal lobe, postcentral gyrus MRA: wide patency of both carotid artery and R vertebral artery diffusely hypoplastic and terminates in PICA, dominant L vertebral artery CTA: no abnormal  anterior or posterior circulation.  2D Echo EF 60 to 65% Bilateral DVT u/s:  no evidence of DVT Hypercoagulable workup pending: those that resulted were wnl TEE positive for PFO LDL 158 HgbA1c 5.8 VTE prophylaxis - lovenox No antithrombotic prior to admission, now on aspirin 81 mg daily and clopidogrel 75 mg daily.  Continue on discharge. Therapy recommendations:  no further needs Disposition:  pending   Hyperlipidemia Home meds:  none LDL 158, goal < 70 Add Lipitor 40 mg  Continue statin at discharge  PFO TEE showed a positive for PFO ROPE score = 9 Will refer to Dr. Excell Seltzer for possible PFO closure.  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 Pt was seen and examined.   Patient wife or girlfriend at bedside.  He reclining in bed, comfortable, neuro intact.  Had a TEE today showed positive for PFO. ROPE score 9, could be a good candidate for PFO closure.  We will have Dr. Excell Seltzer to see him for potential PFO closure.  Continue aspirin and Plavix DVT on discharge.  Continue Lipitor.  For detailed assessment and plan, please refer to above as I have made changes wherever appropriate.   Marvel Plan, MD PhD Stroke Neurology 09/06/2021 7:47 PM   Marvel Plan, MD PhD Stroke Neurology 09/06/2021 12:26 PM    To contact Stroke Continuity provider, please refer to [email or web address]. After hours, contact General Neurology

## 2021-09-06 NOTE — Progress Notes (Signed)
°  Transition of Care (TOC) Screening Note   Patient Details  Name: Bobby Haynes Date of Birth: 1990/09/03   Transition of Care [facility] CM/SW Contact:    Pollie Friar, RN Phone Number: 09/06/2021, 12:08 PM    Transition of Care Department [facility]) has reviewed patient and no TOC needs have been identified at this time. We will continue to monitor patient advancement through interdisciplinary progression rounds. If new patient transition needs arise, please place a TOC consult.

## 2021-09-06 NOTE — Progress Notes (Signed)
°  Echocardiogram Echocardiogram Transesophageal has been performed.  Leta Jungling M 09/06/2021, 11:18 AM

## 2021-09-06 NOTE — Progress Notes (Signed)
PROGRESS NOTE                                                                                                                                                                                                             Patient Demographics:    Bobby Haynes, is a 31 y.o. male, DOB - [birth year], ZOX:[id number]  Outpatient Primary MD for the [facility] is [facility]    LOS - 0  Admit date - 09/04/2021    Chief Complaint  Patient presents with   Neurologic Problem       Brief Narrative (HPI from H&P)  Bobby Haynes is a 31 y.o. male with medical history significant of obesity.  Pt presents to ED with transient left sided numbness.  Patient was having a BM and immediately after he had onset of L sided numbness, facial droop, slurred speech per wife.  Work-up suggestive of CVA.   Subjective:    Bobby Haynes today has, No headache, No chest pain, No abdominal pain - No Nausea, No new weakness tingling or numbness, no SOB.   Assessment  & Plan :    No notes have been filed under this hospital service. 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  Hypercoagulable work-up pending, LDL was above goal hence statin started, A1c pending, TEE positive for PFO structural heart team consulted will be seeing the patient on 09/07/2021. Leg ultrasound negative for DVT, symptoms have improved.  Monitor closely.  2.  Obesity.  Follow with PCP for weight loss.         Condition - Extremely Guarded  Family Communication  :  fwife bedside   Code Status :  Full  Consults  :  Neuro  PUD Prophylaxis :    Procedures  :     TEE - 1. Left ventricular ejection fraction, by estimation, is 60 to 65%. The left ventricle has normal function. The left ventricle  has no regional wall motion abnormalities. Left ventricular diastolic parameters were normal.  2. Right ventricular systolic function is normal. The right ventricular size is normal. There is normal pulmonary artery systolic pressure.  3. Left atrial size was mildly dilated.  4. The  mitral valve is abnormal. Trivial mitral valve regurgitation. No evidence of mitral stenosis.  5. The aortic valve is normal in structure. Aortic valve regurgitation is not visualized. No aortic stenosis is present.  6. The inferior vena cava is normal in size with greater than 50% respiratory variability, suggesting right atrial pressure of 3 mmHg.  7. Agitated saline contrast bubble study was negative, with no evidence of any interatrial shunt.  TTE -  1. Left ventricular ejection fraction, by estimation, is 60 to 65%. The left ventricle has normal function. The left ventricle has no regional wall motion abnormalities. Left ventricular diastolic parameters were normal.  2. Right ventricular systolic function is normal. The right ventricular size is normal. There is normal pulmonary artery systolic pressure.  3. Left atrial size was mildly dilated.  4. The mitral valve is abnormal. Trivial mitral valve regurgitation. No evidence of mitral stenosis.  5. The aortic valve is normal in structure. Aortic valve regurgitation is not visualized. No aortic stenosis is present.  6. The inferior vena cava is normal in size with greater than 50% respiratory variability, suggesting right atrial pressure of 3 mmHg.  7. Agitated saline contrast bubble study was negative, with no evidence of any interatrial shunt.  MRI-A -  1. 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 2. Otherwise normal brain MRI. 3. Moderate paranasal sinus disease with associated right mastoid effusion.   MRA HEAD IMPRESSION: Normal intracranial MRA. No large vessel occlusion, hemodynamically significant  stenosis, or other acute vascular abnormality. MRA NECK IMPRESSION: 1. Normal MRA of the neck with wide patency of both carotid artery systems. 2. 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 Dominant left vertebral artery widely patent   CTA - 1. Normal CTA Head and Neck, with normal anatomic variation of the anterior and posterior circulation. 2. Small right frontal lobe posterior cortical infarct is not apparent by CT. No new intracranial abnormality.       Disposition Plan  :    Status is: Observation  DVT Prophylaxis  :    enoxaparin (LOVENOX) injection 40 mg Start: 09/05/21 0800   Lab Results  Component Value Date   PLT 321 09/06/2021    Diet :  Diet Order             Diet regular Room service appropriate? Yes; Fluid consistency: Thin  Diet effective now                    Inpatient Medications  Scheduled Meds:  aspirin EC  81 mg Oral Daily   atorvastatin  40 mg Oral Daily   busPIRone  5 mg Oral q AM   clopidogrel  75 mg Oral Daily   enoxaparin (LOVENOX) injection  40 mg Subcutaneous Q24H   montelukast  10 mg Oral QHS   traZODone  50 mg Oral QHS   Continuous Infusions: PRN Meds:.acetaminophen **OR** acetaminophen (TYLENOL) oral liquid 160 mg/5 mL **OR** acetaminophen  Antibiotics  :    Anti-infectives (From admission, onward)    None        Time Spent in minutes  30   Susa Raring M.D on 09/06/2021 at 11:36 AM  To page go to [email or web address]   [facility] -  Office  [phone]  See all Orders from today for further details    Objective:   Vitals:   09/06/21 0816 09/06/21 1000 09/06/21 1055 09/06/21 1110  BP: 131/85 136/85 (!) 135/94 118/83  Pulse: 72 78 99 79  Resp: 16 14 18 14   Temp: 97.9 F (36.6 C) (!) 97.4 F (36.3 C) 97.9 F (36.6 C)   TempSrc: Oral Oral    SpO2: 97% 99% 96% 95%  Weight:  113.4 kg    Height:  5\' 9"  (1.753 m)      Wt Readings from Last 3 Encounters:  09/06/21 113.4 kg   06/02/21 115.7 kg     Intake/Output Summary (Last 24 hours) at 09/06/2021 1136 Last data filed at 09/06/2021 1059 Gross per 24 hour  Intake 250 ml  Output --  Net 250 ml     Physical Exam  Awake Alert, No new F.N deficits, Normal affect Bobby Haynes.AT,PERRAL Supple Neck, No JVD,   Symmetrical Chest wall movement, Good air movement bilaterally, CTAB RRR,No Gallops, Rubs or new Murmurs,  +ve B.Sounds, Abd Soft, No tenderness,   No Cyanosis, Clubbing or edema        Data Review:    CBC Recent Labs  Lab 09/04/21 1650 09/04/21 1706 09/06/21 0351  WBC 10.6*  --  9.7  HGB 15.0 15.3 15.0  HCT 45.6 45.0 43.5  PLT 395  --  321  MCV 89.8  --  88.4  MCH 29.5  --  30.5  MCHC 32.9  --  34.5  RDW 12.2  --  12.1  LYMPHSABS 3.4  --  2.4  MONOABS 0.5  --  0.6  EOSABS 0.3  --  0.4  BASOSABS 0.1  --  0.0    Electrolytes Recent Labs  Lab 09/04/21 1650 09/04/21 1706 09/05/21 0635 09/06/21 0351  NA 141 141  --  136  K 3.6 3.5  --  3.9  CL 104 105  --  105  CO2 23  --   --  23  GLUCOSE 97 101*  --  115*  BUN 10 10  --  13  CREATININE 1.12 0.90  --  1.03  CALCIUM 9.4  --   --  9.2  AST 52*  --   --  28  ALT 56*  --   --  47*  ALKPHOS 72  --   --  73  BILITOT 0.5  --   --  0.6  ALBUMIN 4.4  --   --  3.9  MG  --   --   --  2.0  INR 0.9  --   --   --   HGBA1C  --   --  5.8*  --   BNP  --   --   --  15.7    ------------------------------------------------------------------------------------------------------------------ Recent Labs    09/05/21 0635  CHOL 230*  HDL 49  LDLCALC 158*  TRIG 115  CHOLHDL 4.7    Lab Results  Component Value Date   HGBA1C 5.8 (H) 09/05/2021    No results for input(s): TSH, T4TOTAL, T3FREE, THYROIDAB in the last 72 hours.  Invalid input(s): FREET3 ------------------------------------------------------------------------------------------------------------------ ID Labs Recent Labs  Lab 09/04/21 1650 09/04/21 1706 09/06/21 0351  WBC  10.6*  --  9.7  PLT 395  --  321  CREATININE 1.12 0.90 1.03   Cardiac Enzymes No results for input(s): CKMB, TROPONINI, MYOGLOBIN in the last 168 hours.  Invalid input(s): CK     Radiology Reports CT ANGIO HEAD W OR WO CONTRAST  Result Date: 09/05/2021 CLINICAL DATA:  31 year old male with tiny posterior right MCA cortical infarct on brain MRI this morning. EXAM: CT ANGIOGRAPHY HEAD AND NECK TECHNIQUE: Multidetector CT imaging  of the head and neck was performed using the standard protocol during bolus administration of intravenous contrast. Multiplanar CT image reconstructions and MIPs were obtained to evaluate the vascular anatomy. Carotid stenosis measurements (when applicable) are obtained utilizing NASCET criteria, using the distal internal carotid diameter as the denominator. RADIATION DOSE REDUCTION: This exam was performed according to the departmental dose-optimization program which includes automated exposure control, adjustment of the mA and/or kV according to patient size and/or use of iterative reconstruction technique. CONTRAST:  75mL OMNIPAQUE IOHEXOL 350 MG/ML SOLN COMPARISON:  Brain MRI, MRA head and neck 0110 hours today. Head CT yesterday. FINDINGS: CT HEAD Brain: Small posterior right frontal lobe cortical infarct is not apparent by CT. No midline shift, ventriculomegaly, mass effect, evidence of mass lesion, intracranial hemorrhage. Gray-white matter differentiation is within normal limits throughout the brain. Normal cerebral volume. Calvarium and skull base: No acute osseous abnormality identified. Paranasal sinuses: Stable maxillary sinus mucosal thickening and/or retention cysts. Stable scattered mild ethmoid mucosal thickening. Tympanic cavities are clear. Stable right mastoid effusion. Orbits: Visualized orbits and scalp soft tissues are within normal limits. CTA NECK Skeleton: No osseous abnormality identified. Upper chest: Negative. Other neck: Negative. Aortic arch: 3  vessel arch configuration. No arch atherosclerosis. Right carotid system: Mildly obscured right CCA origin due to dense right subclavian venous contrast streak artifact. Otherwise the right CCA, right carotid bifurcation and cervical right ICA appear normal. Left carotid system: Normal. Vertebral arteries: Right subclavian artery origin obscured by dense right subclavian venous contrast. Non dominant right vertebral artery origin appears normal. 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 The right vertebral is patent to the skull base with no stenosis identified. Normal proximal left subclavian artery dominant left vertebral artery origin, and cervical left vertebral artery. CTA HEAD Posterior circulation: Left vertebral artery supplies the basilar. Diminutive right V4 segment terminates in PICA. No distal vertebral or basilar stenosis. Patent SCA and PCA origins. Fetal type right PCA origin. Left posterior communicating artery diminutive or absent. Bilateral PCA branches are within normal limits. Anterior circulation: Both ICA siphons are patent. No siphon plaque or stenosis identified. Right siphon appears somewhat dominant. Normal right posterior communicating artery origin. Patent carotid termini, MCA and ACA origins. Mildly dominant right ACA A1. Normal anterior communicating artery. Bilateral ACA branches are tortuous but otherwise within normal limits. Left MCA M1 segment, bifurcation, and left MCA branches are within normal limits. Right MCA M1 segment and bifurcation are patent without stenosis. Right MCA branches are within normal limits. Venous sinuses: Early contrast timing but grossly patent. Anatomic variants: Dominant left vertebral artery supplies the basilar. Diminutive right vertebral terminates in PICA. Fetal type right PCA origin and mildly dominant right ACA A1. Review of the MIP images  confirms the above findings IMPRESSION: 1. Normal CTA Head and Neck, with normal anatomic variation of the anterior and posterior circulation. 2. Small right frontal lobe posterior cortical infarct is not apparent by CT. No new intracranial abnormality. Electronically Signed   By: Odessa Fleming M.D.   On: 09/05/2021 07:43   CT HEAD WO CONTRAST  Result Date: 09/04/2021 CLINICAL DATA:  Numbness or tingling, paresthesia (Ped 0-17y). Left-sided weakness, numbness EXAM: CT HEAD WITHOUT CONTRAST TECHNIQUE: Contiguous axial images were obtained from the base of the skull through the vertex without intravenous contrast. RADIATION DOSE REDUCTION: This exam was performed according to the departmental dose-optimization program which includes automated exposure control, adjustment of  the mA and/or kV according to patient size and/or use of iterative reconstruction technique. COMPARISON:  None. FINDINGS: Brain: No acute intracranial abnormality. Specifically, no hemorrhage, hydrocephalus, mass lesion, acute infarction, or significant intracranial injury. Vascular: No hyperdense vessel or unexpected calcification. Skull: No acute calvarial abnormality. Sinuses/Orbits: No acute findings. Mucosal thickening in the maxillary sinuses. Other: None IMPRESSION: No acute intracranial abnormality. Chronic maxillary sinusitis. Electronically Signed   By: Charlett Nose M.D.   On: 09/04/2021 17:49   CT ANGIO NECK W OR WO CONTRAST  Result Date: 09/05/2021 CLINICAL DATA:  31 year old male with tiny posterior right MCA cortical infarct on brain MRI this morning. EXAM: CT ANGIOGRAPHY HEAD AND NECK TECHNIQUE: Multidetector CT imaging of the head and neck was performed using the standard protocol during bolus administration of intravenous contrast. Multiplanar CT image reconstructions and MIPs were obtained to evaluate the vascular anatomy. Carotid stenosis measurements (when applicable) are obtained utilizing NASCET criteria, using the distal  internal carotid diameter as the denominator. RADIATION DOSE REDUCTION: This exam was performed according to the departmental dose-optimization program which includes automated exposure control, adjustment of the mA and/or kV according to patient size and/or use of iterative reconstruction technique. CONTRAST:  75mL OMNIPAQUE IOHEXOL 350 MG/ML SOLN COMPARISON:  Brain MRI, MRA head and neck 0110 hours today. Head CT yesterday. FINDINGS: CT HEAD Brain: Small posterior right frontal lobe cortical infarct is not apparent by CT. No midline shift, ventriculomegaly, mass effect, evidence of mass lesion, intracranial hemorrhage. Gray-white matter differentiation is within normal limits throughout the brain. Normal cerebral volume. Calvarium and skull base: No acute osseous abnormality identified. Paranasal sinuses: Stable maxillary sinus mucosal thickening and/or retention cysts. Stable scattered mild ethmoid mucosal thickening. Tympanic cavities are clear. Stable right mastoid effusion. Orbits: Visualized orbits and scalp soft tissues are within normal limits. CTA NECK Skeleton: No osseous abnormality identified. Upper chest: Negative. Other neck: Negative. Aortic arch: 3 vessel arch configuration. No arch atherosclerosis. Right carotid system: Mildly obscured right CCA origin due to dense right subclavian venous contrast streak artifact. Otherwise the right CCA, right carotid bifurcation and cervical right ICA appear normal. Left carotid system: Normal. Vertebral arteries: Right subclavian artery origin obscured by dense right subclavian venous contrast. Non dominant right vertebral artery origin appears normal. 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 The right vertebral is patent to the skull base with no stenosis identified. Normal proximal left subclavian artery dominant left vertebral artery origin, and  cervical left vertebral artery. CTA HEAD Posterior circulation: Left vertebral artery supplies the basilar. Diminutive right V4 segment terminates in PICA. No distal vertebral or basilar stenosis. Patent SCA and PCA origins. Fetal type right PCA origin. Left posterior communicating artery diminutive or absent. Bilateral PCA branches are within normal limits. Anterior circulation: Both ICA siphons are patent. No siphon plaque or stenosis identified. Right siphon appears somewhat dominant. Normal right posterior communicating artery origin. Patent carotid termini, MCA and ACA origins. Mildly dominant right ACA A1. Normal anterior communicating artery. Bilateral ACA branches are tortuous but otherwise within normal limits. Left MCA M1 segment, bifurcation, and left MCA branches are within normal limits. Right MCA M1 segment and bifurcation are patent without stenosis. Right MCA branches are within normal limits. Venous sinuses: Early contrast timing but grossly patent. Anatomic variants: Dominant left vertebral artery supplies the basilar. Diminutive right vertebral terminates in PICA. Fetal type right PCA origin and mildly dominant right ACA A1. Review  of the MIP images confirms the above findings IMPRESSION: 1. Normal CTA Head and Neck, with normal anatomic variation of the anterior and posterior circulation. 2. Small right frontal lobe posterior cortical infarct is not apparent by CT. No new intracranial abnormality. Electronically Signed   By: Odessa FlemingH  Hall M.D.   On: 09/05/2021 07:43   MR ANGIO HEAD WO CONTRAST  Result Date: 09/05/2021 CLINICAL DATA:  Initial evaluation for neuro deficit, stroke suspected, left-sided weakness and numbness. EXAM: MRI HEAD WITHOUT CONTRAST MRA HEAD WITHOUT CONTRAST MRA NECK WITHOUT AND WITH CONTRAST TECHNIQUE: Multiplanar, multi-echo pulse sequences of the brain and surrounding structures were acquired without intravenous contrast. Angiographic images of the Circle of Willis were  acquired using MRA technique without intravenous contrast. Angiographic images of the neck were acquired using MRA technique without and with intravenous contrast. Carotid stenosis measurements (when applicable) are obtained utilizing NASCET criteria, using the distal internal carotid diameter as the denominator. CONTRAST:  10 cc of Gadavist. COMPARISON:  Prior head CT from 09/04/2021. FINDINGS: MRI HEAD FINDINGS Brain: Cerebral volume within normal limits. No significant cerebral white matter disease for age. 6 mm focus of restricted diffusion seen involving the cortical gray matter of the right parietal lobe, postcentral gyrus, consistent with an acute ischemic infarct (series 5, image 88). No associated hemorrhage or mass effect. No other evidence for acute or subacute ischemia. Gray-white matter differentiation otherwise maintained. No other areas of encephalomalacia to suggest chronic cortical infarction. No other acute or chronic intracranial blood products. No mass lesion, midline shift or mass effect no hydrocephalus or extra-axial fluid collection. Pituitary gland suprasellar region normal. Midline structures intact. Vascular: Major intracranial vascular flow voids are maintained. Skull and upper cervical spine: Craniocervical junction within normal limits. Bone marrow signal intensity normal. No scalp soft tissue abnormality. Sinuses/Orbits: Globes and orbital soft tissues within normal limits. Moderate mucosal thickening noted throughout the ethmoidal air cells and maxillary sinuses. Prominent right mastoid effusion. Visualized nasopharynx unremarkable. Other: None. MRA HEAD FINDINGS Anterior circulation: Visualized distal cervical segments of the internal carotid arteries are widely patent with antegrade flow. Petrous, cavernous, and supraclinoid segments patent without stenosis or other abnormality. A1 segments patent. Normal anterior communicating artery complex. Anterior cerebral arteries widely  patent without stenosis. No M1 stenosis or occlusion. Normal MCA bifurcations. Distal MCA branches well perfused and symmetric. Posterior circulation: Left vertebral artery dominant and widely patent. Partially visualized right vertebral artery hypoplastic and terminates in PICA. Both PICA patent. Basilar widely patent to its distal aspect without stenosis. Superior cerebellar arteries patent bilaterally. Left PCA supplied via the basilar. Right PCA supplied via the basilar as well as a robust right posterior communicating artery. Both PCAs well perfused or distal aspects. Anatomic variants: Hypoplastic right vertebral artery terminates in PICA. No aneurysm. MRA NECK FINDINGS Aortic arch: Visualized aortic arch normal caliber with normal branch pattern. No stenosis or other abnormality about the origin the great vessels. Right carotid system: Right common and internal carotid arteries patent without stenosis or evidence for dissection. No atheromatous narrowing or irregularity about the right carotid bulb. Left carotid system: Left common and internal carotid arteries patent without stenosis or evidence for dissection. No atheromatous irregularity or narrowing about the left carotid bulb. Vertebral arteries: Both vertebral arteries arise from the subclavian arteries. Left vertebral artery strongly dominant, with a diffusely hypoplastic right vertebral artery. Vertebral arteries patent without stenosis or dissection. Hypoplastic right vertebral artery terminates in PICA. Other: None IMPRESSION: MRI HEAD IMPRESSION: 1. 6 mm acute ischemic  nonhemorrhagic cortical infarct involving the right parietal lobe, postcentral gyrus. 2. Otherwise normal brain MRI. 3. Moderate paranasal sinus disease with associated right mastoid effusion. MRA HEAD IMPRESSION: Normal intracranial MRA. No large vessel occlusion, hemodynamically significant stenosis, or other acute vascular abnormality. MRA NECK IMPRESSION: 1. Normal MRA of the  neck with wide patency of both carotid artery systems. 2. 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 Dominant left vertebral artery widely patent. Electronically Signed   By: Rise Mu M.D.   On: 09/05/2021 02:46   MR Angiogram Neck W or Wo Contrast  Result Date: 09/05/2021 CLINICAL DATA:  Initial evaluation for neuro deficit, stroke suspected, left-sided weakness and numbness. EXAM: MRI HEAD WITHOUT CONTRAST MRA HEAD WITHOUT CONTRAST MRA NECK WITHOUT AND WITH CONTRAST TECHNIQUE: Multiplanar, multi-echo pulse sequences of the brain and surrounding structures were acquired without intravenous contrast. Angiographic images of the Circle of Willis were acquired using MRA technique without intravenous contrast. Angiographic images of the neck were acquired using MRA technique without and with intravenous contrast. Carotid stenosis measurements (when applicable) are obtained utilizing NASCET criteria, using the distal internal carotid diameter as the denominator. CONTRAST:  10 cc of Gadavist. COMPARISON:  Prior head CT from 09/04/2021. FINDINGS: MRI HEAD FINDINGS Brain: Cerebral volume within normal limits. No significant cerebral white matter disease for age. 6 mm focus of restricted diffusion seen involving the cortical gray matter of the right parietal lobe, postcentral gyrus, consistent with an acute ischemic infarct (series 5, image 88). No associated hemorrhage or mass effect. No other evidence for acute or subacute ischemia. Gray-white matter differentiation otherwise maintained. No other areas of encephalomalacia to suggest chronic cortical infarction. No other acute or chronic intracranial blood products. No mass lesion, midline shift or mass effect no hydrocephalus or extra-axial fluid collection. Pituitary gland suprasellar region normal. Midline structures intact. Vascular: Major intracranial vascular flow voids are maintained. Skull and upper cervical spine:  Craniocervical junction within normal limits. Bone marrow signal intensity normal. No scalp soft tissue abnormality. Sinuses/Orbits: Globes and orbital soft tissues within normal limits. Moderate mucosal thickening noted throughout the ethmoidal air cells and maxillary sinuses. Prominent right mastoid effusion. Visualized nasopharynx unremarkable. Other: None. MRA HEAD FINDINGS Anterior circulation: Visualized distal cervical segments of the internal carotid arteries are widely patent with antegrade flow. Petrous, cavernous, and supraclinoid segments patent without stenosis or other abnormality. A1 segments patent. Normal anterior communicating artery complex. Anterior cerebral arteries widely patent without stenosis. No M1 stenosis or occlusion. Normal MCA bifurcations. Distal MCA branches well perfused and symmetric. Posterior circulation: Left vertebral artery dominant and widely patent. Partially visualized right vertebral artery hypoplastic and terminates in PICA. Both PICA patent. Basilar widely patent to its distal aspect without stenosis. Superior cerebellar arteries patent bilaterally. Left PCA supplied via the basilar. Right PCA supplied via the basilar as well as a robust right posterior communicating artery. Both PCAs well perfused or distal aspects. Anatomic variants: Hypoplastic right vertebral artery terminates in PICA. No aneurysm. MRA NECK FINDINGS Aortic arch: Visualized aortic arch normal caliber with normal branch pattern. No stenosis or other abnormality about the origin the great vessels. Right carotid system: Right common and internal carotid arteries patent without stenosis or evidence for dissection. No atheromatous narrowing or irregularity about the right carotid bulb. Left carotid system: Left common and internal carotid arteries patent without stenosis or evidence for dissection. No atheromatous irregularity or narrowing about the left carotid bulb. Vertebral arteries: Both vertebral  arteries arise from the subclavian arteries.  Left vertebral artery strongly dominant, with a diffusely hypoplastic right vertebral artery. Vertebral arteries patent without stenosis or dissection. Hypoplastic right vertebral artery terminates in PICA. Other: None IMPRESSION: MRI HEAD IMPRESSION: 1. 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 2. Otherwise normal brain MRI. 3. Moderate paranasal sinus disease with associated right mastoid effusion. MRA HEAD IMPRESSION: Normal intracranial MRA. No large vessel occlusion, hemodynamically significant stenosis, or other acute vascular abnormality. MRA NECK IMPRESSION: 1. Normal MRA of the neck with wide patency of both carotid artery systems. 2. 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 Dominant left vertebral artery widely patent. Electronically Signed   By: Rise MuBenjamin  McClintock M.D.   On: 09/05/2021 02:46   MR BRAIN WO CONTRAST  Result Date: 09/05/2021 CLINICAL DATA:  Initial evaluation for neuro deficit, stroke suspected, left-sided weakness and numbness. EXAM: MRI HEAD WITHOUT CONTRAST MRA HEAD WITHOUT CONTRAST MRA NECK WITHOUT AND WITH CONTRAST TECHNIQUE: Multiplanar, multi-echo pulse sequences of the brain and surrounding structures were acquired without intravenous contrast. Angiographic images of the Circle of Willis were acquired using MRA technique without intravenous contrast. Angiographic images of the neck were acquired using MRA technique without and with intravenous contrast. Carotid stenosis measurements (when applicable) are obtained utilizing NASCET criteria, using the distal internal carotid diameter as the denominator. CONTRAST:  10 cc of Gadavist. COMPARISON:  Prior head CT from 09/04/2021. FINDINGS: MRI HEAD FINDINGS Brain: Cerebral volume within normal limits. No significant cerebral white matter disease for age. 6 mm focus of restricted diffusion seen  involving the cortical gray matter of the right parietal lobe, postcentral gyrus, consistent with an acute ischemic infarct (series 5, image 88). No associated hemorrhage or mass effect. No other evidence for acute or subacute ischemia. Gray-white matter differentiation otherwise maintained. No other areas of encephalomalacia to suggest chronic cortical infarction. No other acute or chronic intracranial blood products. No mass lesion, midline shift or mass effect no hydrocephalus or extra-axial fluid collection. Pituitary gland suprasellar region normal. Midline structures intact. Vascular: Major intracranial vascular flow voids are maintained. Skull and upper cervical spine: Craniocervical junction within normal limits. Bone marrow signal intensity normal. No scalp soft tissue abnormality. Sinuses/Orbits: Globes and orbital soft tissues within normal limits. Moderate mucosal thickening noted throughout the ethmoidal air cells and maxillary sinuses. Prominent right mastoid effusion. Visualized nasopharynx unremarkable. Other: None. MRA HEAD FINDINGS Anterior circulation: Visualized distal cervical segments of the internal carotid arteries are widely patent with antegrade flow. Petrous, cavernous, and supraclinoid segments patent without stenosis or other abnormality. A1 segments patent. Normal anterior communicating artery complex. Anterior cerebral arteries widely patent without stenosis. No M1 stenosis or occlusion. Normal MCA bifurcations. Distal MCA branches well perfused and symmetric. Posterior circulation: Left vertebral artery dominant and widely patent. Partially visualized right vertebral artery hypoplastic and terminates in PICA. Both PICA patent. Basilar widely patent to its distal aspect without stenosis. Superior cerebellar arteries patent bilaterally. Left PCA supplied via the basilar. Right PCA supplied via the basilar as well as a robust right posterior communicating artery. Both PCAs well perfused  or distal aspects. Anatomic variants: Hypoplastic right vertebral artery terminates in PICA. No aneurysm. MRA NECK FINDINGS Aortic arch: Visualized aortic arch normal caliber with normal branch pattern. No stenosis or other abnormality about the origin the great vessels. Right carotid system: Right common and internal carotid arteries patent without stenosis or evidence for dissection. No atheromatous narrowing or irregularity about the right carotid bulb. Left carotid system:  Left common and internal carotid arteries patent without stenosis or evidence for dissection. No atheromatous irregularity or narrowing about the left carotid bulb. Vertebral arteries: Both vertebral arteries arise from the subclavian arteries. Left vertebral artery strongly dominant, with a diffusely hypoplastic right vertebral artery. Vertebral arteries patent without stenosis or dissection. Hypoplastic right vertebral artery terminates in PICA. Other: None IMPRESSION: MRI HEAD IMPRESSION: 1. 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 2. Otherwise normal brain MRI. 3. Moderate paranasal sinus disease with associated right mastoid effusion. MRA HEAD IMPRESSION: Normal intracranial MRA. No large vessel occlusion, hemodynamically significant stenosis, or other acute vascular abnormality. MRA NECK IMPRESSION: 1. Normal MRA of the neck with wide patency of both carotid artery systems. 2. 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 Dominant left vertebral artery widely patent. Electronically Signed   By: Rise Mu M.D.   On: 09/05/2021 02:46   ECHOCARDIOGRAM COMPLETE BUBBLE STUDY  Result Date: 09/05/2021    ECHOCARDIOGRAM REPORT   Patient Name:   ALFIO LOESCHER Date of Exam: 09/05/2021 Medical Rec #:  [id number]          Height:       69.0 in Accession #:    [id number]         Weight:       255.0 lb Date of Birth:  1991-01-16          BSA:           2.291 m Patient Age:    30 years           BP:           137/77 mmHg Patient Gender: M                  HR:           85 bpm. Exam Location:  Inpatient Procedure: 2D Echo, Cardiac Doppler and Color Doppler Indications:    Stroke  History:        Patient has no prior history of Echocardiogram examinations.  Sonographer:    Ross Ludwig RDCS (AE) Referring Phys: [id number] [place] [facility]  Sonographer Comments: Patient is morbidly obese. IMPRESSIONS  1. Left ventricular ejection fraction, by estimation, is 60 to 65%. The left ventricle has normal function. The left ventricle has no regional wall motion abnormalities. Left ventricular diastolic parameters were normal.  2. Right ventricular systolic function is normal. The right ventricular size is normal. There is normal pulmonary artery systolic pressure.  3. Left atrial size was mildly dilated.  4. The mitral valve is abnormal. Trivial mitral valve regurgitation. No evidence of mitral stenosis.  5. The aortic valve is normal in structure. Aortic valve regurgitation is not visualized. No aortic stenosis is present.  6. The inferior vena cava is normal in size with greater than 50% respiratory variability, suggesting right atrial pressure of 3 mmHg.  7. Agitated saline contrast bubble study was negative, with no evidence of any interatrial shunt. FINDINGS  Left Ventricle: Left ventricular ejection fraction, by estimation, is 60 to 65%. The left ventricle has normal function. The left ventricle has no regional wall motion abnormalities. The left ventricular internal cavity size was normal in size. There is  no left ventricular hypertrophy. Left ventricular diastolic parameters were normal. Right Ventricle: The right ventricular size is normal. No increase in right ventricular wall thickness. Right ventricular systolic function is normal. There is normal pulmonary artery systolic pressure. The tricuspid regurgitant  velocity is 2.09 m/s, and  with an assumed right atrial  pressure of 3 mmHg, the estimated right ventricular systolic pressure is 20.5 mmHg. Left Atrium: Left atrial size was mildly dilated. Right Atrium: Right atrial size was normal in size. Pericardium: There is no evidence of pericardial effusion. Mitral Valve: The mitral valve is abnormal. There is mild thickening of the mitral valve leaflet(s). There is mild calcification of the mitral valve leaflet(s). Trivial mitral valve regurgitation. No evidence of mitral valve stenosis. Tricuspid Valve: The tricuspid valve is normal in structure. Tricuspid valve regurgitation is mild . No evidence of tricuspid stenosis. Aortic Valve: The aortic valve is normal in structure. Aortic valve regurgitation is not visualized. No aortic stenosis is present. Aortic valve mean gradient measures 3.0 mmHg. Aortic valve peak gradient measures 5.8 mmHg. Aortic valve area, by VTI measures 2.78 cm. Pulmonic Valve: The pulmonic valve was normal in structure. Pulmonic valve regurgitation is not visualized. No evidence of pulmonic stenosis. Aorta: The aortic root is normal in size and structure. Venous: The inferior vena cava is normal in size with greater than 50% respiratory variability, suggesting right atrial pressure of 3 mmHg. IAS/Shunts: No atrial level shunt detected by color flow Doppler. Agitated saline contrast bubble study was negative, with no evidence of any interatrial shunt.  LEFT VENTRICLE PLAX 2D LVIDd:         5.20 cm   Diastology LVIDs:         3.20 cm   LV e' medial:    11.70 cm/s LV PW:         1.30 cm   LV E/e' medial:  9.2 LV IVS:        0.90 cm   LV e' lateral:   13.30 cm/s LVOT diam:     2.10 cm   LV E/e' lateral: 8.1 LV SV:         63 LV SV Index:   28 LVOT Area:     3.46 cm  RIGHT VENTRICLE             IVC RV Basal diam:  3.40 cm     IVC diam: 1.70 cm RV S prime:     13.10 cm/s TAPSE (M-mode): 2.2 cm LEFT ATRIUM           Index        RIGHT ATRIUM           Index LA diam:      4.00 cm 1.75 cm/m   RA Area:     13.40  cm LA Vol (A4C): 59.2 ml 25.84 ml/m  RA Volume:   29.50 ml  12.88 ml/m  AORTIC VALVE AV Area (Vmax):    2.77 cm AV Area (Vmean):   2.78 cm AV Area (VTI):     2.78 cm AV Vmax:           120.00 cm/s AV Vmean:          79.100 cm/s AV VTI:            0.227 m AV Peak Grad:      5.8 mmHg AV Mean Grad:      3.0 mmHg LVOT Vmax:         95.90 cm/s LVOT Vmean:        63.500 cm/s LVOT VTI:          0.182 m LVOT/AV VTI ratio: 0.80  AORTA Ao Root diam: 3.50 cm Ao Asc diam:  3.10 cm MITRAL VALVE  TRICUSPID VALVE MV Area (PHT): 4.96 cm     TR Peak grad:   17.5 mmHg MV Decel Time: 153 msec     TR Vmax:        209.00 cm/s MV E velocity: 108.00 cm/s MV A velocity: 75.80 cm/s   SHUNTS MV E/A ratio:  1.42         Systemic VTI:  0.18 m                             Systemic Diam: 2.10 cm Charlton Haws MD Electronically signed by Charlton Haws MD Signature Date/Time: 09/05/2021/11:49:48 AM    Final    VAS [place] LOWER EXTREMITY VENOUS (DVT)  Result Date: 09/05/2021  Lower Venous DVT Study Patient Name:  DASON MOSLEY  Date of Exam:   09/05/2021 Medical Rec #: [id number]           Accession #:    [id number] Date of Birth: [birth year]           Patient Gender: M Patient Age:   67 years Exam Location:  [facility] Procedure:      VAS [place] LOWER EXTREMITY VENOUS (DVT) Referring Phys: MCNEILL KIRKPATRICK --------------------------------------------------------------------------------  Indications: Stroke.  Comparison Study: No prior study on file Performing Technologist: Sherren Kerns RVS  Examination Guidelines: A complete evaluation includes B-mode imaging, spectral Doppler, color Doppler, and power Doppler as needed of all accessible portions of each vessel. Bilateral testing is considered an integral part of a complete examination. Limited examinations for reoccurring indications may be performed as noted. The reflux portion of the exam is performed with the patient in reverse Trendelenburg.   +---------+---------------+---------+-----------+----------+--------------+  RIGHT     Compressibility Phasicity Spontaneity Properties Thrombus Aging  +---------+---------------+---------+-----------+----------+--------------+  CFV       Full            Yes       Yes                                    +---------+---------------+---------+-----------+----------+--------------+  SFJ       Full                                                             +---------+---------------+---------+-----------+----------+--------------+  FV Prox   Full                                                             +---------+---------------+---------+-----------+----------+--------------+  FV Mid    Full                                                             +---------+---------------+---------+-----------+----------+--------------+  FV Distal Full                                                             +---------+---------------+---------+-----------+----------+--------------+  PFV       Full                                                             +---------+---------------+---------+-----------+----------+--------------+  POP       Full            Yes       Yes                                    +---------+---------------+---------+-----------+----------+--------------+  PTV       Full                                                             +---------+---------------+---------+-----------+----------+--------------+  PERO      Full                                                             +---------+---------------+---------+-----------+----------+--------------+   +---------+---------------+---------+-----------+----------+---------------+  LEFT      Compressibility Phasicity Spontaneity Properties Thrombus Aging   +---------+---------------+---------+-----------+----------+---------------+  CFV       Full            Yes       Yes                                      +---------+---------------+---------+-----------+----------+---------------+  SFJ       Full                                                              +---------+---------------+---------+-----------+----------+---------------+  FV Prox   Full                                                              +---------+---------------+---------+-----------+----------+---------------+  FV Mid    Full                                                              +---------+---------------+---------+-----------+----------+---------------+  FV Distal Full                                                              +---------+---------------+---------+-----------+----------+---------------+  PFV       Full                                                              +---------+---------------+---------+-----------+----------+---------------+  POP       Full            Yes       Yes                                     +---------+---------------+---------+-----------+----------+---------------+  PTV                                                        patent by color  +---------+---------------+---------+-----------+----------+---------------+  PERO                                                       patent by color  +---------+---------------+---------+-----------+----------+---------------+     Summary: BILATERAL: -No evidence of popliteal cyst, bilaterally. RIGHT: - There is no evidence of deep vein thrombosis in the lower extremity.  LEFT: - There is no evidence of deep vein thrombosis in the lower extremity.  *See table(s) above for measurements and observations. Electronically signed by Waverly Ferrari MD on 09/05/2021 at 11:24:42 AM.    Final

## 2021-09-06 NOTE — Anesthesia Preprocedure Evaluation (Addendum)
Anesthesia Evaluation  Patient identified by MRN, date of birth, ID band Patient awake    Reviewed: Allergy & Precautions, NPO status , Patient's Chart, lab work & pertinent test results  Airway Mallampati: II  TM Distance: >3 FB Neck ROM: Full    Dental no notable dental hx. (+) Teeth Intact, Dental Advisory Given   Pulmonary asthma ,    Pulmonary exam normal breath sounds clear to auscultation       Cardiovascular negative cardio ROS Normal cardiovascular exam Rhythm:Regular Rate:Normal  TTE 2023 1. Left ventricular ejection fraction, by estimation, is 60 to 65%. The  left ventricle has normal function. The left ventricle has no regional  wall motion abnormalities. Left ventricular diastolic parameters were  normal.  2. Right ventricular systolic function is normal. The right ventricular  size is normal. There is normal pulmonary artery systolic pressure.  3. Left atrial size was mildly dilated.  4. The mitral valve is abnormal. Trivial mitral valve regurgitation. No  evidence of mitral stenosis.  5. The aortic valve is normal in structure. Aortic valve regurgitation is  not visualized. No aortic stenosis is present.  6. The inferior vena cava is normal in size with greater than 50%  respiratory variability, suggesting right atrial pressure of 3 mmHg.  7. Agitated saline contrast bubble study was negative, with no evidence  of any interatrial shunt.    Neuro/Psych PSYCHIATRIC DISORDERS Depression CVA, No Residual Symptoms    GI/Hepatic Neg liver ROS, GERD  ,  Endo/Other  negative endocrine ROS  Renal/GU negative Renal ROS  negative genitourinary   Musculoskeletal negative musculoskeletal ROS (+)   Abdominal   Peds  Hematology negative hematology ROS (+)   Anesthesia Other Findings Acute embolic stroke  Reproductive/Obstetrics                            Anesthesia  Physical Anesthesia Plan  ASA: 2  Anesthesia Plan: MAC   Post-op Pain Management:    Induction: Intravenous  PONV Risk Score and Plan: Propofol infusion and Treatment may vary due to age or medical condition  Airway Management Planned: Natural Airway  Additional Equipment:   Intra-op Plan:   Post-operative Plan:   Informed Consent: I have reviewed the patients History and Physical, chart, labs and discussed the procedure including the risks, benefits and alternatives for the proposed anesthesia with the patient or authorized representative who has indicated his/her understanding and acceptance.     Dental advisory given  Plan Discussed with: CRNA  Anesthesia Plan Comments:         Anesthesia Quick Evaluation

## 2021-09-06 NOTE — Plan of Care (Signed)
Pt arrived from ER via stretcher. Walked independently from stretcher in hallway to bed in room, gait steady. Pt oriented to room and assessed. Wife at bedside.   Problem: Education: Goal: Knowledge of disease or condition will improve Outcome: Progressing Goal: Knowledge of secondary prevention will improve (SELECT ALL) Outcome: Progressing Goal: Knowledge of patient specific risk factors will improve (INDIVIDUALIZE FOR PATIENT) Outcome: Progressing   Problem: Coping: Goal: Will verbalize positive feelings about self Outcome: Progressing   Problem: Health Behavior/Discharge Planning: Goal: Ability to manage health-related needs will improve Outcome: Progressing

## 2021-09-07 ENCOUNTER — Encounter (HOSPITAL_COMMUNITY): Payer: Self-pay | Admitting: Cardiology

## 2021-09-07 ENCOUNTER — Telehealth: Payer: Self-pay | Admitting: Cardiovascular Disease

## 2021-09-07 LAB — COMPREHENSIVE METABOLIC PANEL
ALT: 45 U/L — ABNORMAL HIGH (ref 0–44)
AST: 27 U/L (ref 15–41)
Albumin: 3.8 g/dL (ref 3.5–5.0)
Alkaline Phosphatase: 64 U/L (ref 38–126)
Anion gap: 8 (ref 5–15)
BUN: 14 mg/dL (ref 6–20)
CO2: 23 mmol/L (ref 22–32)
Calcium: 9 mg/dL (ref 8.9–10.3)
Chloride: 105 mmol/L (ref 98–111)
Creatinine, Ser: 1.23 mg/dL (ref 0.61–1.24)
GFR, Estimated: >60 mL/min (ref >60)
Glucose, Bld: 111 mg/dL — ABNORMAL HIGH (ref 70–99)
Potassium: 4.4 mmol/L (ref 3.5–5.1)
Sodium: 136 mmol/L (ref 135–145)
Total Bilirubin: 0.5 mg/dL (ref 0.3–1.2)
Total Protein: 6.7 g/dL (ref 6.5–8.1)

## 2021-09-07 LAB — CBC WITH DIFFERENTIAL/PLATELET
Abs Immature Granulocytes: 0.04 10*3/uL (ref 0.00–0.07)
Basophils Absolute: 0.1 10*3/uL (ref 0.0–0.1)
Basophils Relative: 1 %
Eosinophils Absolute: 0.3 10*3/uL (ref 0.0–0.5)
Eosinophils Relative: 3 %
HCT: 44 % (ref 39.0–52.0)
Hemoglobin: 15.1 g/dL (ref 13.0–17.0)
Immature Granulocytes: 0 %
Lymphocytes Relative: 24 %
Lymphs Abs: 2.3 10*3/uL (ref 0.7–4.0)
MCH: 30.4 pg (ref 26.0–34.0)
MCHC: 34.3 g/dL (ref 30.0–36.0)
MCV: 88.5 fL (ref 80.0–100.0)
Monocytes Absolute: 0.8 10*3/uL (ref 0.1–1.0)
Monocytes Relative: 8 %
Neutro Abs: 6.2 10*3/uL (ref 1.7–7.7)
Neutrophils Relative %: 64 %
Platelets: 297 10*3/uL (ref 150–400)
RBC: 4.97 MIL/uL (ref 4.22–5.81)
RDW: 11.9 % (ref 11.5–15.5)
WBC: 9.7 10*3/uL (ref 4.0–10.5)
nRBC: 0 % (ref 0.0–0.2)

## 2021-09-07 LAB — BRAIN NATRIURETIC PEPTIDE: B Natriuretic Peptide: 47.1 pg/mL (ref 0.0–100.0)

## 2021-09-07 LAB — MAGNESIUM: Magnesium: 2 mg/dL (ref 1.7–2.4)

## 2021-09-07 LAB — PROTEIN C, TOTAL: Protein C, Total: 112 % (ref 60–150)

## 2021-09-07 MED ORDER — ASPIRIN 81 MG PO TBEC: 81.0000 mg | DELAYED_RELEASE_TABLET | Freq: Every day | ORAL | 0 refills | Status: AC

## 2021-09-07 MED ORDER — ATORVASTATIN CALCIUM 40 MG PO TABS: 40.0000 mg | ORAL_TABLET | Freq: Every day | ORAL | 0 refills | Status: AC

## 2021-09-07 MED ORDER — CLOPIDOGREL BISULFATE 75 MG PO TABS: 75.0000 mg | ORAL_TABLET | Freq: Every day | ORAL | 0 refills | Status: DC

## 2021-09-07 NOTE — Discharge Summary (Signed)
Bobby Haynes VY:[id number] DOB: 1991/08/10 DOA: 09/04/2021  PCP: [facility]  Admit date: 09/04/2021  Discharge date: 09/07/2021  Admitted From: Home   Disposition:  Home   Recommendations for Outpatient Follow-up:   Follow up with PCP in 1-2 weeks  PCP Please obtain BMP/CBC, 2 view CXR in 1week,  (see Discharge instructions)   PCP Please follow up on the following pending results: Must follow-up with cardiologist within 2 weeks for PFO closure, monitor secondary risk factors for stroke closely.  Also follow with neurology within 4 weeks   Home Health: None   Equipment/Devices: None  Consultations: Neuro, Cards Discharge Condition: Stable    CODE STATUS: Full    Diet Recommendation: Heart Healthy   Diet Order             Diet - low sodium heart healthy           Diet regular Room service appropriate? Yes; Fluid consistency: Thin  Diet effective now                    Chief Complaint  Patient presents with   Neurologic Problem     Brief history of present illness from the day of admission and additional interim summary    Bobby Haynes is a 31 y.o. male with medical history significant of obesity.  Pt presents to ED with transient left sided numbness.  Patient was having a BM and immediately after he had onset of L sided numbness, facial droop, slurred speech per wife.  Work-up suggestive of CVA.                                                                 Hospital Course    6 mm acute ischemic right parietal lobe infarct - his symptoms have currently resolved, he will be on dual antiplatelet therapy and statin, full stroke work-up, stroke team to follow.  Hypercoagulable work-up pending, LDL was above goal hence statin started, A1c pending, TEE positive for PFO structural  heart team consulted, they have seen the patient will follow-up in the outpatient setting within a few weeks for PFO closure, for now continue aspirin and Plavix along with statin upon discharge, 1 month supply provided. Leg ultrasound negative for DVT, symptoms have improved and almost completely resolved.  Discharged with outpatient PCP, cardiology and neurology follow-up.   2.  Obesity.  Follow with PCP for weight loss.   Lab Results  Component Value Date   HGBA1C 5.8 (H) 09/05/2021   Lab Results  Component Value Date   CHOL 230 (H) 09/05/2021   HDL 49 09/05/2021   LDLCALC 158 (H) 09/05/2021   TRIG 115 09/05/2021   CHOLHDL 4.7 09/05/2021      Discharge diagnosis     Principal Problem:  Acute embolic stroke [facility])    Discharge instructions    Discharge Instructions     Diet - low sodium heart healthy   Complete by: As directed    Discharge instructions   Complete by: As directed    Follow with Primary Christmas in 7 days, also follow with the recommended neurologist and cardiologist within 2 weeks of discharge.  Get CBC, CMP, 2 view Chest X ray -  checked next visit within 1 week by Primary MD   Activity: As tolerated with Full fall precautions use walker/cane & assistance as needed, do not overexert or strain till your PFO is closed, no weightlifting until PFO is closed  Disposition Home    Diet: Heart Healthy   Special Instructions: If you have smoked or chewed Tobacco  in the last 2 yrs please stop smoking, stop any regular Alcohol  and or any Recreational drug use.  On your next visit with your primary care physician please Get Medicines reviewed and adjusted.  Please request your Prim.MD to go over all Hospital Tests and Procedure/Radiological results at the follow up, please get all Hospital records sent to your Prim MD by signing hospital release before you go home.  If you experience worsening of your admission symptoms, develop shortness of  breath, life threatening emergency, suicidal or homicidal thoughts you must seek medical attention immediately by calling 911 or calling your MD immediately  if symptoms less severe.  You Must read complete instructions/literature along with all the possible adverse reactions/side effects for all the Medicines you take and that have been prescribed to you. Take any new Medicines after you have completely understood and accpet all the possible adverse reactions/side effects.   Increase activity slowly   Complete by: As directed        Discharge Medications   Allergies as of 09/07/2021   No Known Allergies      Medication List     TAKE these medications    albuterol 108 (90 Base) MCG/ACT inhaler Commonly known as: VENTOLIN HFA INHALE 2 PUFFS BY MOUTH EVERY 6 HOURS AS NEEDED FOR WHEEZING   aspirin 81 MG EC tablet Take 1 tablet (81 mg total) by mouth daily. Swallow whole. Start taking on: September 08, 2021   atorvastatin 40 MG tablet Commonly known as: LIPITOR Take 1 tablet (40 mg total) by mouth daily. Start taking on: September 08, 2021   busPIRone 10 MG tablet Commonly known as: BUSPAR Take 5 mg by mouth in the morning.   Cholecalciferol 25 MCG (1000 UT) tablet Take 1 tablet by mouth daily.   clopidogrel 75 MG tablet Commonly known as: PLAVIX Take 1 tablet (75 mg total) by mouth daily. Start taking on: September 08, 2021   hydrOXYzine 10 MG tablet Commonly known as: ATARAX Take 10 mg by mouth daily as needed for anxiety.   magnesium 30 MG tablet Take 30 mg by mouth daily.   montelukast 10 MG tablet Commonly known as: SINGULAIR Take 1 tablet by mouth daily.   MULTIVITAMIN ADULTS PO Take 3 tablets by mouth daily.   rizatriptan 10 MG disintegrating tablet Commonly known as: Maxalt-MLT Take 1 tablet earliest onset of migraine.  May repeat in 2 hours if needed.  Maximum 2 tablets in 24 hours.   traZODone 50 MG tablet Commonly known as: DESYREL Take 50 mg by mouth  at bedtime.   ZINC PO Take 1 tablet by mouth daily.         Follow-up  [place]. Schedule an appointment as soon as possible for a visit in 1 week(s).   Specialty: General Practice Contact information: [address] [place] [id number] [phone]         [place]. Schedule an appointment as soon as possible for a visit in 1 week(s).   Contact information: [address]     [place] [phone]        Sherren Mocha, MD. Schedule an appointment as soon as possible for a visit in 1 week(s).   Specialty: Cardiology Why: PFO with stroke Contact information: [id number] [address] [address] [place] [address] [phone]                 Major procedures and Radiology Reports - PLEASE review detailed and final reports thoroughly  -       CT ANGIO HEAD W OR WO CONTRAST  Result Date: 09/05/2021 CLINICAL DATA:  31 year old male with tiny posterior right MCA cortical infarct on brain MRI this morning. EXAM: CT ANGIOGRAPHY HEAD AND NECK TECHNIQUE: Multidetector CT imaging of the head and neck was performed using the standard protocol during bolus administration of intravenous contrast. Multiplanar CT image reconstructions and MIPs were obtained to evaluate the vascular anatomy. Carotid stenosis measurements (when applicable) are obtained utilizing NASCET criteria, using the distal internal carotid diameter as the denominator. RADIATION DOSE REDUCTION: This exam was performed according to the departmental dose-optimization program which includes automated exposure control, adjustment of the mA and/or kV according to patient size and/or use of iterative reconstruction technique. CONTRAST:  59mL OMNIPAQUE IOHEXOL 350 MG/ML SOLN COMPARISON:  Brain MRI, MRA head and neck 0110 hours today. Head CT yesterday. FINDINGS: CT HEAD Brain: Small posterior right frontal lobe cortical  infarct is not apparent by CT. No midline shift, ventriculomegaly, mass effect, evidence of mass lesion, intracranial hemorrhage. Gray-white matter differentiation is within normal limits throughout the brain. Normal cerebral volume. Calvarium and skull base: No acute osseous abnormality identified. Paranasal sinuses: Stable maxillary sinus mucosal thickening and/or retention cysts. Stable scattered mild ethmoid mucosal thickening. Tympanic cavities are clear. Stable right mastoid effusion. Orbits: Visualized orbits and scalp soft tissues are within normal limits. CTA NECK Skeleton: No osseous abnormality identified. Upper chest: Negative. Other neck: Negative. Aortic arch: 3 vessel arch configuration. No arch atherosclerosis. Right carotid system: Mildly obscured right CCA origin due to dense right subclavian venous contrast streak artifact. Otherwise the right CCA, right carotid bifurcation and cervical right ICA appear normal. Left carotid system: Normal. Vertebral arteries: Right subclavian artery origin obscured by dense right subclavian venous contrast. Non dominant right vertebral artery origin appears normal. 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 The right vertebral is patent to the skull base with no stenosis identified. Normal proximal left subclavian artery dominant left vertebral artery origin, and cervical left vertebral artery. CTA HEAD Posterior circulation: Left vertebral artery supplies the basilar. Diminutive right V4 segment terminates in PICA. No distal vertebral or basilar stenosis. Patent SCA and PCA origins. Fetal type right PCA origin. Left posterior communicating artery diminutive or absent. Bilateral PCA branches are within normal limits. Anterior circulation: Both ICA siphons are patent. No siphon plaque or stenosis identified. Right siphon appears somewhat dominant. Normal  right posterior communicating artery origin. Patent carotid termini, MCA and ACA origins. Mildly dominant right ACA A1. Normal anterior communicating artery. Bilateral  ACA branches are tortuous but otherwise within normal limits. Left MCA M1 segment, bifurcation, and left MCA branches are within normal limits. Right MCA M1 segment and bifurcation are patent without stenosis. Right MCA branches are within normal limits. Venous sinuses: Early contrast timing but grossly patent. Anatomic variants: Dominant left vertebral artery supplies the basilar. Diminutive right vertebral terminates in PICA. Fetal type right PCA origin and mildly dominant right ACA A1. Review of the MIP images confirms the above findings IMPRESSION: 1. Normal CTA Head and Neck, with normal anatomic variation of the anterior and posterior circulation. 2. Small right frontal lobe posterior cortical infarct is not apparent by CT. No new intracranial abnormality. Electronically Signed   By: Genevie Ann M.D.   On: 09/05/2021 07:43   CT HEAD WO CONTRAST  Result Date: 09/04/2021 CLINICAL DATA:  Numbness or tingling, paresthesia (Ped 0-17y). Left-sided weakness, numbness EXAM: CT HEAD WITHOUT CONTRAST TECHNIQUE: Contiguous axial images were obtained from the base of the skull through the vertex without intravenous contrast. RADIATION DOSE REDUCTION: This exam was performed according to the departmental dose-optimization program which includes automated exposure control, adjustment of the mA and/or kV according to patient size and/or use of iterative reconstruction technique. COMPARISON:  None. FINDINGS: Brain: No acute intracranial abnormality. Specifically, no hemorrhage, hydrocephalus, mass lesion, acute infarction, or significant intracranial injury. Vascular: No hyperdense vessel or unexpected calcification. Skull: No acute calvarial abnormality. Sinuses/Orbits: No acute findings. Mucosal thickening in the maxillary sinuses. Other: None IMPRESSION: No  acute intracranial abnormality. Chronic maxillary sinusitis. Electronically Signed   By: Rolm Baptise M.D.   On: 09/04/2021 17:49   CT ANGIO NECK W OR WO CONTRAST  Result Date: 09/05/2021 CLINICAL DATA:  31 year old male with tiny posterior right MCA cortical infarct on brain MRI this morning. EXAM: CT ANGIOGRAPHY HEAD AND NECK TECHNIQUE: Multidetector CT imaging of the head and neck was performed using the standard protocol during bolus administration of intravenous contrast. Multiplanar CT image reconstructions and MIPs were obtained to evaluate the vascular anatomy. Carotid stenosis measurements (when applicable) are obtained utilizing NASCET criteria, using the distal internal carotid diameter as the denominator. RADIATION DOSE REDUCTION: This exam was performed according to the departmental dose-optimization program which includes automated exposure control, adjustment of the mA and/or kV according to patient size and/or use of iterative reconstruction technique. CONTRAST:  37mL OMNIPAQUE IOHEXOL 350 MG/ML SOLN COMPARISON:  Brain MRI, MRA head and neck 0110 hours today. Head CT yesterday. FINDINGS: CT HEAD Brain: Small posterior right frontal lobe cortical infarct is not apparent by CT. No midline shift, ventriculomegaly, mass effect, evidence of mass lesion, intracranial hemorrhage. Gray-white matter differentiation is within normal limits throughout the brain. Normal cerebral volume. Calvarium and skull base: No acute osseous abnormality identified. Paranasal sinuses: Stable maxillary sinus mucosal thickening and/or retention cysts. Stable scattered mild ethmoid mucosal thickening. Tympanic cavities are clear. Stable right mastoid effusion. Orbits: Visualized orbits and scalp soft tissues are within normal limits. CTA NECK Skeleton: No osseous abnormality identified. Upper chest: Negative. Other neck: Negative. Aortic arch: 3 vessel arch configuration. No arch atherosclerosis. Right carotid system: Mildly  obscured right CCA origin due to dense right subclavian venous contrast streak artifact. Otherwise the right CCA, right carotid bifurcation and cervical right ICA appear normal. Left carotid system: Normal. Vertebral arteries: Right subclavian artery origin obscured by dense right subclavian venous contrast. Non dominant right vertebral artery origin appears normal. Right vertebral artery is diminutive throughout the neck, with associated congenitally small right cervical transverse  foramen (series 11, image 203). The right vertebral is patent to the skull base with no stenosis identified. Normal proximal left subclavian artery dominant left vertebral artery origin, and cervical left vertebral artery. CTA HEAD Posterior circulation: Left vertebral artery supplies the basilar. Diminutive right V4 segment terminates in PICA. No distal vertebral or basilar stenosis. Patent SCA and PCA origins. Fetal type right PCA origin. Left posterior communicating artery diminutive or absent. Bilateral PCA branches are within normal limits. Anterior circulation: Both ICA siphons are patent. No siphon plaque or stenosis identified. Right siphon appears somewhat dominant. Normal right posterior communicating artery origin. Patent carotid termini, MCA and ACA origins. Mildly dominant right ACA A1. Normal anterior communicating artery. Bilateral ACA branches are tortuous but otherwise within normal limits. Left MCA M1 segment, bifurcation, and left MCA branches are within normal limits. Right MCA M1 segment and bifurcation are patent without stenosis. Right MCA branches are within normal limits. Venous sinuses: Early contrast timing but grossly patent. Anatomic variants: Dominant left vertebral artery supplies the basilar. Diminutive right vertebral terminates in PICA. Fetal type right PCA origin and mildly dominant right ACA A1. Review of the MIP images confirms the above findings IMPRESSION: 1. Normal CTA Head and Neck, with normal  anatomic variation of the anterior and posterior circulation. 2. Small right frontal lobe posterior cortical infarct is not apparent by CT. No new intracranial abnormality. Electronically Signed   By: Genevie Ann M.D.   On: 09/05/2021 07:43   MR ANGIO HEAD WO CONTRAST  Result Date: 09/05/2021 CLINICAL DATA:  Initial evaluation for neuro deficit, stroke suspected, left-sided weakness and numbness. EXAM: MRI HEAD WITHOUT CONTRAST MRA HEAD WITHOUT CONTRAST MRA NECK WITHOUT AND WITH CONTRAST TECHNIQUE: Multiplanar, multi-echo pulse sequences of the brain and surrounding structures were acquired without intravenous contrast. Angiographic images of the Circle of Willis were acquired using MRA technique without intravenous contrast. Angiographic images of the neck were acquired using MRA technique without and with intravenous contrast. Carotid stenosis measurements (when applicable) are obtained utilizing NASCET criteria, using the distal internal carotid diameter as the denominator. CONTRAST:  10 cc of Gadavist. COMPARISON:  Prior head CT from 09/04/2021. FINDINGS: MRI HEAD FINDINGS Brain: Cerebral volume within normal limits. No significant cerebral white matter disease for age. 6 mm focus of restricted diffusion seen involving the cortical gray matter of the right parietal lobe, postcentral gyrus, consistent with an acute ischemic infarct (series 5, image 88). No associated hemorrhage or mass effect. No other evidence for acute or subacute ischemia. Gray-white matter differentiation otherwise maintained. No other areas of encephalomalacia to suggest chronic cortical infarction. No other acute or chronic intracranial blood products. No mass lesion, midline shift or mass effect no hydrocephalus or extra-axial fluid collection. Pituitary gland suprasellar region normal. Midline structures intact. Vascular: Major intracranial vascular flow voids are maintained. Skull and upper cervical spine: Craniocervical junction within  normal limits. Bone marrow signal intensity normal. No scalp soft tissue abnormality. Sinuses/Orbits: Globes and orbital soft tissues within normal limits. Moderate mucosal thickening noted throughout the ethmoidal air cells and maxillary sinuses. Prominent right mastoid effusion. Visualized nasopharynx unremarkable. Other: None. MRA HEAD FINDINGS Anterior circulation: Visualized distal cervical segments of the internal carotid arteries are widely patent with antegrade flow. Petrous, cavernous, and supraclinoid segments patent without stenosis or other abnormality. A1 segments patent. Normal anterior communicating artery complex. Anterior cerebral arteries widely patent without stenosis. No M1 stenosis or occlusion. Normal MCA bifurcations. Distal MCA branches well perfused and symmetric. Posterior circulation: Left vertebral  artery dominant and widely patent. Partially visualized right vertebral artery hypoplastic and terminates in PICA. Both PICA patent. Basilar widely patent to its distal aspect without stenosis. Superior cerebellar arteries patent bilaterally. Left PCA supplied via the basilar. Right PCA supplied via the basilar as well as a robust right posterior communicating artery. Both PCAs well perfused or distal aspects. Anatomic variants: Hypoplastic right vertebral artery terminates in PICA. No aneurysm. MRA NECK FINDINGS Aortic arch: Visualized aortic arch normal caliber with normal branch pattern. No stenosis or other abnormality about the origin the great vessels. Right carotid system: Right common and internal carotid arteries patent without stenosis or evidence for dissection. No atheromatous narrowing or irregularity about the right carotid bulb. Left carotid system: Left common and internal carotid arteries patent without stenosis or evidence for dissection. No atheromatous irregularity or narrowing about the left carotid bulb. Vertebral arteries: Both vertebral arteries arise from the subclavian  arteries. Left vertebral artery strongly dominant, with a diffusely hypoplastic right vertebral artery. Vertebral arteries patent without stenosis or dissection. Hypoplastic right vertebral artery terminates in PICA. Other: None IMPRESSION: MRI HEAD IMPRESSION: 1. 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 2. Otherwise normal brain MRI. 3. Moderate paranasal sinus disease with associated right mastoid effusion. MRA HEAD IMPRESSION: Normal intracranial MRA. No large vessel occlusion, hemodynamically significant stenosis, or other acute vascular abnormality. MRA NECK IMPRESSION: 1. Normal MRA of the neck with wide patency of both carotid artery systems. 2. 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 Dominant left vertebral artery widely patent. Electronically Signed   By: Jeannine Boga M.D.   On: 09/05/2021 02:46   MR Angiogram Neck W or Wo Contrast  Result Date: 09/05/2021 CLINICAL DATA:  Initial evaluation for neuro deficit, stroke suspected, left-sided weakness and numbness. EXAM: MRI HEAD WITHOUT CONTRAST MRA HEAD WITHOUT CONTRAST MRA NECK WITHOUT AND WITH CONTRAST TECHNIQUE: Multiplanar, multi-echo pulse sequences of the brain and surrounding structures were acquired without intravenous contrast. Angiographic images of the Circle of Willis were acquired using MRA technique without intravenous contrast. Angiographic images of the neck were acquired using MRA technique without and with intravenous contrast. Carotid stenosis measurements (when applicable) are obtained utilizing NASCET criteria, using the distal internal carotid diameter as the denominator. CONTRAST:  10 cc of Gadavist. COMPARISON:  Prior head CT from 09/04/2021. FINDINGS: MRI HEAD FINDINGS Brain: Cerebral volume within normal limits. No significant cerebral white matter disease for age. 6 mm focus of restricted diffusion seen involving the cortical  gray matter of the right parietal lobe, postcentral gyrus, consistent with an acute ischemic infarct (series 5, image 88). No associated hemorrhage or mass effect. No other evidence for acute or subacute ischemia. Gray-white matter differentiation otherwise maintained. No other areas of encephalomalacia to suggest chronic cortical infarction. No other acute or chronic intracranial blood products. No mass lesion, midline shift or mass effect no hydrocephalus or extra-axial fluid collection. Pituitary gland suprasellar region normal. Midline structures intact. Vascular: Major intracranial vascular flow voids are maintained. Skull and upper cervical spine: Craniocervical junction within normal limits. Bone marrow signal intensity normal. No scalp soft tissue abnormality. Sinuses/Orbits: Globes and orbital soft tissues within normal limits. Moderate mucosal thickening noted throughout the ethmoidal air cells and maxillary sinuses. Prominent right mastoid effusion. Visualized nasopharynx unremarkable. Other: None. MRA HEAD FINDINGS Anterior circulation: Visualized distal cervical segments of the internal carotid arteries are widely patent with antegrade flow. Petrous, cavernous, and supraclinoid segments patent without stenosis or  other abnormality. A1 segments patent. Normal anterior communicating artery complex. Anterior cerebral arteries widely patent without stenosis. No M1 stenosis or occlusion. Normal MCA bifurcations. Distal MCA branches well perfused and symmetric. Posterior circulation: Left vertebral artery dominant and widely patent. Partially visualized right vertebral artery hypoplastic and terminates in PICA. Both PICA patent. Basilar widely patent to its distal aspect without stenosis. Superior cerebellar arteries patent bilaterally. Left PCA supplied via the basilar. Right PCA supplied via the basilar as well as a robust right posterior communicating artery. Both PCAs well perfused or distal aspects.  Anatomic variants: Hypoplastic right vertebral artery terminates in PICA. No aneurysm. MRA NECK FINDINGS Aortic arch: Visualized aortic arch normal caliber with normal branch pattern. No stenosis or other abnormality about the origin the great vessels. Right carotid system: Right common and internal carotid arteries patent without stenosis or evidence for dissection. No atheromatous narrowing or irregularity about the right carotid bulb. Left carotid system: Left common and internal carotid arteries patent without stenosis or evidence for dissection. No atheromatous irregularity or narrowing about the left carotid bulb. Vertebral arteries: Both vertebral arteries arise from the subclavian arteries. Left vertebral artery strongly dominant, with a diffusely hypoplastic right vertebral artery. Vertebral arteries patent without stenosis or dissection. Hypoplastic right vertebral artery terminates in PICA. Other: None IMPRESSION: MRI HEAD IMPRESSION: 1. 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 2. Otherwise normal brain MRI. 3. Moderate paranasal sinus disease with associated right mastoid effusion. MRA HEAD IMPRESSION: Normal intracranial MRA. No large vessel occlusion, hemodynamically significant stenosis, or other acute vascular abnormality. MRA NECK IMPRESSION: 1. Normal MRA of the neck with wide patency of both carotid artery systems. 2. 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 Dominant left vertebral artery widely patent. Electronically Signed   By: Jeannine Boga M.D.   On: 09/05/2021 02:46   MR BRAIN WO CONTRAST  Result Date: 09/05/2021 CLINICAL DATA:  Initial evaluation for neuro deficit, stroke suspected, left-sided weakness and numbness. EXAM: MRI HEAD WITHOUT CONTRAST MRA HEAD WITHOUT CONTRAST MRA NECK WITHOUT AND WITH CONTRAST TECHNIQUE: Multiplanar, multi-echo pulse sequences of the brain and surrounding  structures were acquired without intravenous contrast. Angiographic images of the Circle of Willis were acquired using MRA technique without intravenous contrast. Angiographic images of the neck were acquired using MRA technique without and with intravenous contrast. Carotid stenosis measurements (when applicable) are obtained utilizing NASCET criteria, using the distal internal carotid diameter as the denominator. CONTRAST:  10 cc of Gadavist. COMPARISON:  Prior head CT from 09/04/2021. FINDINGS: MRI HEAD FINDINGS Brain: Cerebral volume within normal limits. No significant cerebral white matter disease for age. 6 mm focus of restricted diffusion seen involving the cortical gray matter of the right parietal lobe, postcentral gyrus, consistent with an acute ischemic infarct (series 5, image 88). No associated hemorrhage or mass effect. No other evidence for acute or subacute ischemia. Gray-white matter differentiation otherwise maintained. No other areas of encephalomalacia to suggest chronic cortical infarction. No other acute or chronic intracranial blood products. No mass lesion, midline shift or mass effect no hydrocephalus or extra-axial fluid collection. Pituitary gland suprasellar region normal. Midline structures intact. Vascular: Major intracranial vascular flow voids are maintained. Skull and upper cervical spine: Craniocervical junction within normal limits. Bone marrow signal intensity normal. No scalp soft tissue abnormality. Sinuses/Orbits: Globes and orbital soft tissues within normal limits. Moderate mucosal thickening noted throughout the ethmoidal air cells and maxillary sinuses. Prominent right mastoid effusion. Visualized  nasopharynx unremarkable. Other: None. MRA HEAD FINDINGS Anterior circulation: Visualized distal cervical segments of the internal carotid arteries are widely patent with antegrade flow. Petrous, cavernous, and supraclinoid segments patent without stenosis or other abnormality.  A1 segments patent. Normal anterior communicating artery complex. Anterior cerebral arteries widely patent without stenosis. No M1 stenosis or occlusion. Normal MCA bifurcations. Distal MCA branches well perfused and symmetric. Posterior circulation: Left vertebral artery dominant and widely patent. Partially visualized right vertebral artery hypoplastic and terminates in PICA. Both PICA patent. Basilar widely patent to its distal aspect without stenosis. Superior cerebellar arteries patent bilaterally. Left PCA supplied via the basilar. Right PCA supplied via the basilar as well as a robust right posterior communicating artery. Both PCAs well perfused or distal aspects. Anatomic variants: Hypoplastic right vertebral artery terminates in PICA. No aneurysm. MRA NECK FINDINGS Aortic arch: Visualized aortic arch normal caliber with normal branch pattern. No stenosis or other abnormality about the origin the great vessels. Right carotid system: Right common and internal carotid arteries patent without stenosis or evidence for dissection. No atheromatous narrowing or irregularity about the right carotid bulb. Left carotid system: Left common and internal carotid arteries patent without stenosis or evidence for dissection. No atheromatous irregularity or narrowing about the left carotid bulb. Vertebral arteries: Both vertebral arteries arise from the subclavian arteries. Left vertebral artery strongly dominant, with a diffusely hypoplastic right vertebral artery. Vertebral arteries patent without stenosis or dissection. Hypoplastic right vertebral artery terminates in PICA. Other: None IMPRESSION: MRI HEAD IMPRESSION: 1. 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 2. Otherwise normal brain MRI. 3. Moderate paranasal sinus disease with associated right mastoid effusion. MRA HEAD IMPRESSION: Normal intracranial MRA. No large vessel occlusion, hemodynamically  significant stenosis, or other acute vascular abnormality. MRA NECK IMPRESSION: 1. Normal MRA of the neck with wide patency of both carotid artery systems. 2. 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 Dominant left vertebral artery widely patent. Electronically Signed   By: Jeannine Boga M.D.   On: 09/05/2021 02:46   ECHO TEE  Result Date: 09/06/2021    TRANSESOPHOGEAL ECHO REPORT   Patient Name:   Bobby Haynes Date of Exam: 09/06/2021 Medical Rec #:  [id number]          Height:       69.0 in Accession #:    [id number]         Weight:       250.0 lb Date of Birth:  01/31/1991          BSA:          2.272 m Patient Age:    30 years           BP:           152/99 mmHg Patient Gender: M                  HR:           99 bpm. Exam Location:  Inpatient Procedure: Transesophageal Echo, 3D Echo, Cardiac Doppler, Color Doppler and            Saline Contrast Bubble Study Indications:     Cerebral Infarction, unspecified I63.9  History:         Patient has prior history of Echocardiogram examinations, most                  recent 09/05/2021. GERD.  Sonographer:     Jonelle Sidle  Burt Knack RDCS Referring Phys:  [id number] Jerline Pain Diagnosing Phys: Candee Furbish MD PROCEDURE: After discussion of the risks and benefits of a TEE, an informed consent was obtained from the patient. TEE procedure time was 7 minutes. The transesophogeal probe was passed without difficulty through the esophogus of the patient. Imaged were  obtained with the patient in a left lateral decubitus position. Local oropharyngeal anesthetic was provided with Cetacaine. Sedation performed by different physician. The patient was monitored while under deep sedation. Anesthestetic sedation was provided intravenously by Anesthesiology: 374.84mg  of Propofol, 100mg  of Lidocaine. Image quality was good. The patient's vital signs; including heart rate, blood pressure, and oxygen saturation; remained stable throughout the procedure. The  patient developed no complications during the procedure. IMPRESSIONS  1. PFO present.  2. Left ventricular ejection fraction, by estimation, is 60 to 65%. The left ventricle has normal function. The left ventricle has no regional wall motion abnormalities.  3. Right ventricular systolic function is normal. The right ventricular size is normal.  4. No left atrial/left atrial appendage thrombus was detected.  5. The mitral valve is normal in structure. No evidence of mitral valve regurgitation. No evidence of mitral stenosis.  6. The aortic valve is normal in structure. Aortic valve regurgitation is not visualized. No aortic stenosis is present.  7. The inferior vena cava is normal in size with greater than 50% respiratory variability, suggesting right atrial pressure of 3 mmHg.  8. Evidence of atrial level shunting detected by color flow Doppler. There is a moderately sized patent foramen ovale with predominantly right to left shunting across the atrial septum. FINDINGS  Left Ventricle: Left ventricular ejection fraction, by estimation, is 60 to 65%. The left ventricle has normal function. The left ventricle has no regional wall motion abnormalities. The left ventricular internal cavity size was normal in size. There is  no left ventricular hypertrophy. Right Ventricle: The right ventricular size is normal. No increase in right ventricular wall thickness. Right ventricular systolic function is normal. Left Atrium: Left atrial size was normal in size. No left atrial/left atrial appendage thrombus was detected. Right Atrium: Right atrial size was normal in size. Pericardium: There is no evidence of pericardial effusion. Mitral Valve: The mitral valve is normal in structure. No evidence of mitral valve regurgitation. No evidence of mitral valve stenosis. Tricuspid Valve: The tricuspid valve is normal in structure. Tricuspid valve regurgitation is not demonstrated. No evidence of tricuspid stenosis. Aortic Valve: The  aortic valve is normal in structure. Aortic valve regurgitation is not visualized. No aortic stenosis is present. Pulmonic Valve: The pulmonic valve was normal in structure. Pulmonic valve regurgitation is not visualized. No evidence of pulmonic stenosis. Aorta: The aortic root is normal in size and structure. Venous: The inferior vena cava is normal in size with greater than 50% respiratory variability, suggesting right atrial pressure of 3 mmHg. IAS/Shunts: Evidence of atrial level shunting detected by color flow Doppler. Agitated saline contrast was given intravenously to evaluate for intracardiac shunting. A moderately sized patent foramen ovale is detected with predominantly right to left shunting across the atrial septum. Additional Comments: PFO present.  TRICUSPID VALVE TR Peak grad:   21.3 mmHg TR Vmax:        231.00 cm/s Candee Furbish MD Electronically signed by Candee Furbish MD Signature Date/Time: 09/06/2021/1:03:12 PM    Final    ECHOCARDIOGRAM COMPLETE BUBBLE STUDY  Result Date: 09/05/2021    ECHOCARDIOGRAM REPORT   Patient Name:   Bobby Haynes Date of  Exam: 09/05/2021 Medical Rec #:  [id number]          Height:       69.0 in Accession #:    [id number]         Weight:       255.0 lb Date of Birth:  [birth year]          BSA:          2.291 m Patient Age:    30 years           BP:           137/77 mmHg Patient Gender: M                  HR:           85 bpm. Exam Location:  Inpatient Procedure: 2D Echo, Cardiac Doppler and Color Doppler Indications:    Stroke  History:        Patient has no prior history of Echocardiogram examinations.  Sonographer:    Clayton Lefort RDCS (AE) Referring Phys: [id number] Margaree Mackintosh [facility]  Sonographer Comments: Patient is morbidly obese. IMPRESSIONS  1. Left ventricular ejection fraction, by estimation, is 60 to 65%. The left ventricle has normal function. The left ventricle has no regional wall motion abnormalities. Left ventricular diastolic parameters were normal.  2. Right  ventricular systolic function is normal. The right ventricular size is normal. There is normal pulmonary artery systolic pressure.  3. Left atrial size was mildly dilated.  4. The mitral valve is abnormal. Trivial mitral valve regurgitation. No evidence of mitral stenosis.  5. The aortic valve is normal in structure. Aortic valve regurgitation is not visualized. No aortic stenosis is present.  6. The inferior vena cava is normal in size with greater than 50% respiratory variability, suggesting right atrial pressure of 3 mmHg.  7. Agitated saline contrast bubble study was negative, with no evidence of any interatrial shunt. FINDINGS  Left Ventricle: Left ventricular ejection fraction, by estimation, is 60 to 65%. The left ventricle has normal function. The left ventricle has no regional wall motion abnormalities. The left ventricular internal cavity size was normal in size. There is  no left ventricular hypertrophy. Left ventricular diastolic parameters were normal. Right Ventricle: The right ventricular size is normal. No increase in right ventricular wall thickness. Right ventricular systolic function is normal. There is normal pulmonary artery systolic pressure. The tricuspid regurgitant velocity is 2.09 m/s, and  with an assumed right atrial pressure of 3 mmHg, the estimated right ventricular systolic pressure is 123XX123 mmHg. Left Atrium: Left atrial size was mildly dilated. Right Atrium: Right atrial size was normal in size. Pericardium: There is no evidence of pericardial effusion. Mitral Valve: The mitral valve is abnormal. There is mild thickening of the mitral valve leaflet(s). There is mild calcification of the mitral valve leaflet(s). Trivial mitral valve regurgitation. No evidence of mitral valve stenosis. Tricuspid Valve: The tricuspid valve is normal in structure. Tricuspid valve regurgitation is mild . No evidence of tricuspid stenosis. Aortic Valve: The aortic valve is normal in structure. Aortic valve  regurgitation is not visualized. No aortic stenosis is present. Aortic valve mean gradient measures 3.0 mmHg. Aortic valve peak gradient measures 5.8 mmHg. Aortic valve area, by VTI measures 2.78 cm. Pulmonic Valve: The pulmonic valve was normal in structure. Pulmonic valve regurgitation is not visualized. No evidence of pulmonic stenosis. Aorta: The aortic root is normal in size and structure. Venous: The inferior vena cava is normal in  size with greater than 50% respiratory variability, suggesting right atrial pressure of 3 mmHg. IAS/Shunts: No atrial level shunt detected by color flow Doppler. Agitated saline contrast bubble study was negative, with no evidence of any interatrial shunt.  LEFT VENTRICLE PLAX 2D LVIDd:         5.20 cm   Diastology LVIDs:         3.20 cm   LV e' medial:    11.70 cm/s LV PW:         1.30 cm   LV E/e' medial:  9.2 LV IVS:        0.90 cm   LV e' lateral:   13.30 cm/s LVOT diam:     2.10 cm   LV E/e' lateral: 8.1 LV SV:         63 LV SV Index:   28 LVOT Area:     3.46 cm  RIGHT VENTRICLE             IVC RV Basal diam:  3.40 cm     IVC diam: 1.70 cm RV S prime:     13.10 cm/s TAPSE (M-mode): 2.2 cm LEFT ATRIUM           Index        RIGHT ATRIUM           Index LA diam:      4.00 cm 1.75 cm/m   RA Area:     13.40 cm LA Vol (A4C): 59.2 ml 25.84 ml/m  RA Volume:   29.50 ml  12.88 ml/m  AORTIC VALVE AV Area (Vmax):    2.77 cm AV Area (Vmean):   2.78 cm AV Area (VTI):     2.78 cm AV Vmax:           120.00 cm/s AV Vmean:          79.100 cm/s AV VTI:            0.227 m AV Peak Grad:      5.8 mmHg AV Mean Grad:      3.0 mmHg LVOT Vmax:         95.90 cm/s LVOT Vmean:        63.500 cm/s LVOT VTI:          0.182 m LVOT/AV VTI ratio: 0.80  AORTA Ao Root diam: 3.50 cm Ao Asc diam:  3.10 cm MITRAL VALVE                TRICUSPID VALVE MV Area (PHT): 4.96 cm     TR Peak grad:   17.5 mmHg MV Decel Time: 153 msec     TR Vmax:        209.00 cm/s MV E velocity: 108.00 cm/s MV A velocity: 75.80  cm/s   SHUNTS MV E/A ratio:  1.42         Systemic VTI:  0.18 m                             Systemic Diam: 2.10 cm Jenkins Rouge MD Electronically signed by Jenkins Rouge MD Signature Date/Time: 09/05/2021/11:49:48 AM    Final    VAS [place] LOWER EXTREMITY VENOUS (DVT)  Result Date: 09/05/2021  Lower Venous DVT Study Patient Name:  Bobby Haynes  Date of Exam:   09/05/2021 Medical Rec #: [id number]           Accession #:    [id number] Date of  Birth: 02/20/1991           Patient Gender: M Patient Age:   62 years Exam Location:  [facility] Procedure:      VAS [place] LOWER EXTREMITY VENOUS (DVT) Referring Phys: MCNEILL KIRKPATRICK --------------------------------------------------------------------------------  Indications: Stroke.  Comparison Study: No prior study on file Performing Technologist: Sharion Dove RVS  Examination Guidelines: A complete evaluation includes B-mode imaging, spectral Doppler, color Doppler, and power Doppler as needed of all accessible portions of each vessel. Bilateral testing is considered an integral part of a complete examination. Limited examinations for reoccurring indications may be performed as noted. The reflux portion of the exam is performed with the patient in reverse Trendelenburg.  +---------+---------------+---------+-----------+----------+--------------+  RIGHT     Compressibility Phasicity Spontaneity Properties Thrombus Aging  +---------+---------------+---------+-----------+----------+--------------+  CFV       Full            Yes       Yes                                    +---------+---------------+---------+-----------+----------+--------------+  SFJ       Full                                                             +---------+---------------+---------+-----------+----------+--------------+  FV Prox   Full                                                             +---------+---------------+---------+-----------+----------+--------------+  FV Mid    Full                                                              +---------+---------------+---------+-----------+----------+--------------+  FV Distal Full                                                             +---------+---------------+---------+-----------+----------+--------------+  PFV       Full                                                             +---------+---------------+---------+-----------+----------+--------------+  POP       Full            Yes       Yes                                    +---------+---------------+---------+-----------+----------+--------------+  PTV       Full                                                             +---------+---------------+---------+-----------+----------+--------------+  PERO      Full                                                             +---------+---------------+---------+-----------+----------+--------------+   +---------+---------------+---------+-----------+----------+---------------+  LEFT      Compressibility Phasicity Spontaneity Properties Thrombus Aging   +---------+---------------+---------+-----------+----------+---------------+  CFV       Full            Yes       Yes                                     +---------+---------------+---------+-----------+----------+---------------+  SFJ       Full                                                              +---------+---------------+---------+-----------+----------+---------------+  FV Prox   Full                                                              +---------+---------------+---------+-----------+----------+---------------+  FV Mid    Full                                                              +---------+---------------+---------+-----------+----------+---------------+  FV Distal Full                                                              +---------+---------------+---------+-----------+----------+---------------+  PFV       Full                                                               +---------+---------------+---------+-----------+----------+---------------+  POP       Full            Yes       Yes                                     +---------+---------------+---------+-----------+----------+---------------+  PTV                                                        patent by color  +---------+---------------+---------+-----------+----------+---------------+  PERO                                                       patent by color  +---------+---------------+---------+-----------+----------+---------------+     Summary: BILATERAL: -No evidence of popliteal cyst, bilaterally. RIGHT: - There is no evidence of deep vein thrombosis in the lower extremity.  LEFT: - There is no evidence of deep vein thrombosis in the lower extremity.  *See table(s) above for measurements and observations. Electronically signed by Deitra Mayo MD on 09/05/2021 at 11:24:42 AM.    Final       Today   Subjective    Bobby Haynes today has no headache,no chest abdominal pain,no new weakness tingling or numbness, feels much better wants to go home today.     Objective   Blood pressure 126/83, pulse 85, temperature 97.7 F (36.5 C), temperature source Oral, resp. rate 16, height 5\' 9"  (1.753 m), weight 113.4 kg, SpO2 98 %.   Intake/Output Summary (Last 24 hours) at 09/07/2021 1110 Last data filed at 09/06/2021 1300 Gross per 24 hour  Intake 240 ml  Output --  Net 240 ml    Exam  Awake Alert, No new F.N deficits,    [place].AT,PERRAL Supple Neck,   Symmetrical Chest wall movement, Good air movement bilaterally, CTAB RRR,No Gallops,   +ve B.Sounds, Abd Soft, Non tender,  No Cyanosis, Clubbing or edema    Data Review   CBC w Diff:  Lab Results  Component Value Date   WBC 9.7 09/07/2021   HGB 15.1 09/07/2021   HCT 44.0 09/07/2021   PLT 297 09/07/2021   LYMPHOPCT 24 09/07/2021   MONOPCT 8 09/07/2021   EOSPCT 3 09/07/2021   BASOPCT 1 09/07/2021    CMP:   Lab Results  Component Value Date   NA 136 09/07/2021   K 4.4 09/07/2021   CL 105 09/07/2021   CO2 23 09/07/2021   BUN 14 09/07/2021   CREATININE 1.23 09/07/2021   PROT 6.7 09/07/2021   ALBUMIN 3.8 09/07/2021   BILITOT 0.5 09/07/2021   ALKPHOS 64 09/07/2021   AST 27 09/07/2021   ALT 45 (H) 09/07/2021  .   Total Time in preparing paper work, data evaluation and todays exam - 40 minutes  Lala Lund M.D on 09/07/2021 at 11:10 AM  [facility]

## 2021-09-07 NOTE — Plan of Care (Signed)
°  °                                    [facility]                            [address] [place], [place] [address]      Bobby Haynes was admitted to the Hospital on 09/04/2021 and Discharged  09/07/2021 and should be excused from work/school   for 5  days starting from date -  09/04/2021 , may return to work/school without any restrictions.  Call Lala Lund MD, [facility]  [phone] with questions.  Lala Lund M.D on 09/07/2021,at 11:35 AM  [facility]   Office  [phone]

## 2021-09-07 NOTE — Telephone Encounter (Signed)
Pt is calling to schedule his PFO closure with Dr. Burt Knack.  He was discharged from the hospital today for cryptogenic stroke and PFO.  Dr. Burt Knack consulted on the pt this morning.  Pts discharge instructions stated that he will see Dr. Burt Knack in outpatient setting within a few weeks to discuss arranging PFO closure.   Will route this message to our Structural RN Navigators to further follow-up with the pt.

## 2021-09-07 NOTE — Plan of Care (Signed)
°  Problem: Education: Goal: Knowledge of disease or condition will improve Outcome: Progressing Goal: Knowledge of secondary prevention will improve (SELECT ALL) Outcome: Progressing Goal: Knowledge of patient specific risk factors will improve (INDIVIDUALIZE FOR PATIENT) Outcome: Progressing   Problem: Coping: Goal: Will verbalize positive feelings about self Outcome: Progressing   Problem: Health Behavior/Discharge Planning: Goal: Ability to manage health-related needs will improve Outcome: Progressing   Problem: Education: Goal: Knowledge of General Education information will improve Description: Including pain rating scale, medication(s)/side effects and non-pharmacologic comfort measures Outcome: Progressing   Problem: Health Behavior/Discharge Planning: Goal: Ability to manage health-related needs will improve Outcome: Progressing   Problem: Clinical Measurements: Goal: Ability to maintain clinical measurements within normal limits will improve Outcome: Progressing Goal: Will remain free from infection Outcome: Progressing Goal: Cardiovascular complication will be avoided Outcome: Progressing   Problem: Activity: Goal: Risk for activity intolerance will decrease Outcome: Progressing   Problem: Nutrition: Goal: Adequate nutrition will be maintained Outcome: Progressing   Problem: Coping: Goal: Level of anxiety will decrease Outcome: Progressing   Problem: Elimination: Goal: Will not experience complications related to urinary retention Outcome: Progressing   Problem: Pain Managment: Goal: General experience of comfort will improve Outcome: Progressing   Problem: Safety: Goal: Ability to remain free from injury will improve Outcome: Progressing

## 2021-09-07 NOTE — Progress Notes (Addendum)
STROKE TEAM PROGRESS NOTE   INTERVAL HISTORY Wife at bedside. Patient reports feeling back to baseline. Discussed that we would recommend patient follow up with Dr. Excell Seltzer for PFO closure and scheduling for this. Patient verbalized agreement and had no other questions at this time.   Vitals:   09/07/21 0316 09/07/21 0756 09/07/21 0800 09/07/21 1115  BP: (!) 110/58 126/83    Pulse: 72 85  89  Resp: 15 16  17   Temp: 98 F (36.7 C) 97.7 F (36.5 C)  97.9 F (36.6 C)  TempSrc: Oral Oral  Oral  SpO2: 97% 98% 98% 97%  Weight:      Height:       CBC:  Recent Labs  Lab 09/06/21 0351 09/07/21 0355  WBC 9.7 9.7  NEUTROABS 6.3 6.2  HGB 15.0 15.1  HCT 43.5 44.0  MCV 88.4 88.5  PLT 321 297    Basic Metabolic Panel:  Recent Labs  Lab 09/06/21 0351 09/07/21 0355  NA 136 136  K 3.9 4.4  CL 105 105  CO2 23 23  GLUCOSE 115* 111*  BUN 13 14  CREATININE 1.03 1.23  CALCIUM 9.2 9.0  MG 2.0 2.0    Lipid Panel:  Recent Labs  Lab 09/05/21 0635  CHOL 230*  TRIG 115  HDL 49  CHOLHDL 4.7  VLDL 23  LDLCALC 09/07/21*    HgbA1c:  Recent Labs  Lab 09/05/21 0635  HGBA1C 5.8*    Urine Drug Screen:  Recent Labs  Lab 09/05/21 0543  LABOPIA NONE DETECTED  COCAINSCRNUR NONE DETECTED  LABBENZ POSITIVE*  AMPHETMU NONE DETECTED  THCU NONE DETECTED  LABBARB NONE DETECTED     Alcohol Level No results for input(s): ETH in the last 168 hours.  IMAGING past 24 hours No results found.  PHYSICAL EXAM  Temp:  [97.7 F (36.5 C)-98.2 F (36.8 C)] 97.9 F (36.6 C) (01/24 1115) Pulse Rate:  [72-90] 89 (01/24 1115) Resp:  [15-18] 17 (01/24 1115) BP: (110-140)/(58-86) 126/83 (01/24 0756) SpO2:  [97 %-98 %] 97 % (01/24 1115)  General - Well nourished, well developed, young male in no apparent distress.  Cardiovascular - Regular rhythm and rate.  Mental Status -  Level of arousal and orientation to time, place, and person were intact. Language including expression, naming,  repetition, comprehension was assessed and found intact. Attention span and concentration were normal. Recent and remote memory were intact. Fund of Knowledge was assessed and was intact.  Cranial Nerves II - XII - II - Visual field intact OU. III, IV, VI - Extraocular movements intact. V - Facial sensation intact bilaterally. VII - Facial movement intact bilaterally. VIII - Hearing & vestibular intact bilaterally. X - Palate elevates symmetrically. XI - Chin turning & shoulder shrug intact bilaterally. XII - Tongue protrusion intact.  Motor Strength - The patients strength was normal in all extremities and pronator drift was absent.  Bulk was normal and fasciculations were absent.   Motor Tone - Muscle tone was assessed at the neck and appendages and was normal.  Reflexes - The patients reflexes were symmetrical in all extremities and he had no pathological reflexes.  Sensory - Light touch, temperature/pinprick were assessed and were symmetrical.    Coordination - The patient had normal movements in the hands and feet with no ataxia or dysmetria.  Tremor was absent.  Gait and Station - deferred.   ASSESSMENT/PLAN Mr. Raliegh Scobie is a 31 y.o. male with history of migraines with visual aura, obesity  presenting with transient 5 minute episode of L sided numbness, weakness, dysarthria, and blurry vision.   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   MRI: 6 mm acute ischemic nonhemorrhagic cortical infarct involving the R parietal lobe, postcentral gyrus MRA: wide patency of both carotid artery and R vertebral artery diffusely hypoplastic and terminates in PICA, dominant L vertebral artery CTA: no abnormal anterior or posterior circulation.  2D Echo EF 60 to 65% Bilateral DVT u/s:  no evidence of DVT Hypercoagulable workup pending: those that resulted were wnl TEE positive for PFO LDL 158 HgbA1c  5.8 VTE prophylaxis - lovenox No antithrombotic prior to admission, now on aspirin 81 mg daily and clopidogrel 75 mg daily.  Continue on discharge. Therapy recommendations:  no further needs Disposition:  home today   Hyperlipidemia Home meds:  none LDL 158, goal < 70 Add Lipitor 40 mg  Continue statin at discharge  PFO TEE showed a positive for PFO ROPE score = 9 Dr. Excell Seltzer will consider PFO closure next month.  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 Pt was seen and examined.   Neuro stable, wife/girlfriend at bedside. No acute event overnight. Dr. Excell Seltzer saw him today and plan for PFO closure next month. Continue DAPT and statin on discharge.   For detailed assessment and plan, please refer to above as I have made changes wherever appropriate.   Neurology will sign off. Please call with questions. Pt will follow up with stroke clinic NP at [facility] in about 4 weeks. Thanks for the consult.   Marvel Plan, MD PhD Stroke Neurology 09/07/2021 1:02 PM    To contact Stroke Continuity provider, please refer to [email or web address]. After hours, contact General Neurology

## 2021-09-07 NOTE — Consult Note (Addendum)
HEART AND [place]  Cardiology Consultation:   Patient ID: Bobby Haynes MRN: [id number]; DOB: 02-20-1991  Admit date: 09/04/2021 Date of Consult: 09/07/2021  Primary Care Provider: [place] Cardiologist: None  [facility] Electrophysiologist:  None    Patient Profile:   Bobby Haynes is a 31 y.o. male with a hx of obesity and migraines who is being seen today for the evaluation of acute CVA and PFO at the request of Dr. Candiss Norse.  History of Present Illness:   Bobby Haynes presented to the [facility] ED on 09/05/2021 for evaluation of transient right-sided numbness, aphagia and blurred vision while having a bowel movement.  Head CT showed no acute intracranial abnormality.  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 MRA showed wide patency of both carotid arteries and R vertebral artery diffusely hypoplastic and terminates in PICA, dominant L vertebral artery. CTA showed no abnormal anterior or posterior circulation. Transthoracic echo showed EF 60 to 65% with a negative bubble study.  Bilateral lower extremity Dopplers were negative for DVT.  Hypercoagulable work-up is underway and negative thus far. TEE on 09/06/2021 showed EF 60 to 65% with evidence of atrial level shunting by color-flow Doppler as well as a moderately sized PFO with predominantly right to left shunting across the atrial septum.  Structural heart is consulted for consideration of PFO closure.  He is seen sitting up in the bed.  His wife is present.  He denies any recurrent neurologic symptoms.  He is feeling back to his baseline.  He denies a smoking history or illicit drug use.  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  He denies chest pain or shortness of breath..   Past Medical History:  Diagnosis Date    Depression    GERD (gastroesophageal reflux disease)    Insomnia     History reviewed. No pertinent surgical history.   Home Medications:  Prior to Admission medications   Medication Sig Start Date End Date Taking? Authorizing Provider  busPIRone (BUSPAR) 10 MG tablet Take 5 mg by mouth in the morning. 04/14/21  Yes [provider]  Cholecalciferol 25 MCG (1000 UT) tablet Take 1 tablet by mouth daily. 09/25/20  Yes [provider]  hydrOXYzine (ATARAX/VISTARIL) 10 MG tablet Take 10 mg by mouth daily as needed for anxiety. 04/14/21  Yes [provider]  magnesium 30 MG tablet Take 30 mg by mouth daily.   Yes [provider]  montelukast (SINGULAIR) 10 MG tablet Take 1 tablet by mouth daily. 03/01/21  Yes [provider]  Multiple Vitamins-Minerals (MULTIVITAMIN ADULTS PO) Take 3 tablets by mouth daily.   Yes [provider]  Multiple Vitamins-Minerals (ZINC PO) Take 1 tablet by mouth daily.   Yes [provider]  rizatriptan (MAXALT-MLT) 10 MG disintegrating tablet Take 1 tablet earliest onset of migraine.  May repeat in 2 hours if needed.  Maximum 2 tablets in 24 hours. 06/02/21  Yes Jaffe, Adam R, DO  traZODone (DESYREL) 50 MG tablet Take 50 mg by mouth at bedtime. 04/14/21  Yes [provider]  albuterol (VENTOLIN HFA) 108 (90 Base) MCG/ACT inhaler INHALE 2 PUFFS BY MOUTH EVERY 6 HOURS AS NEEDED FOR WHEEZING 09/21/20   [provider]    Inpatient Medications: Scheduled Meds:  aspirin EC  81 mg Oral Daily   atorvastatin  40 mg Oral Daily   busPIRone  5 mg Oral q AM   clopidogrel  75 mg Oral Daily   enoxaparin (LOVENOX) injection  40 mg Subcutaneous Q24H   montelukast  10 mg Oral QHS   traZODone  50 mg Oral QHS   Continuous Infusions:  PRN Meds: acetaminophen **OR** acetaminophen (TYLENOL) oral liquid 160 mg/5 mL **OR** acetaminophen  Allergies:   No Known Allergies  Social History:   Social History    Socioeconomic History   Marital status: Married    Spouse name: Not on file   Number of children: Not on file   Years of education: Not on file   Highest education level: Not on file  Occupational History   Not on file  Tobacco Use   Smoking status: Never    Passive exposure: Never   Smokeless tobacco: Never  Vaping Use   Vaping Use: Never used  Substance and Sexual Activity   Alcohol use: Yes   Drug use: Never   Sexual activity: Not on file  Other Topics Concern   Not on file  Social History Narrative   Left handed   Social Determinants of Health   Financial Resource Strain: Not on file  Food Insecurity: Not on file  Transportation Needs: Not on file  Physical Activity: Not on file  Stress: Not on file  Social Connections: Not on file  Intimate Partner Violence: Not on file    Family History:    Family History  Problem Relation Age of Onset   Dementia Maternal Grandmother    Dementia Maternal Grandfather      ROS:  Please see the history of present illness.  All other ROS reviewed and negative.     Physical Exam/Data:   Vitals:   09/06/21 1900 09/06/21 2312 09/07/21 0316 09/07/21 0756  BP: 140/69 136/86 (!) 110/58 126/83  Pulse: 90 79 72 85  Resp: 18 16 15 16   Temp: 97.7 F (36.5 C) 98.2 F (36.8 C) 98 F (36.7 C) 97.7 F (36.5 C)  TempSrc: Oral Oral Oral Oral  SpO2: 98% 98% 97% 98%  Weight:      Height:        Intake/Output Summary (Last 24 hours) at 09/07/2021 0856 Last data filed at 09/06/2021 1300 Gross per 24 hour  Intake 610 ml  Output --  Net 610 ml   Last 3 Weights 09/06/2021 06/02/2021  Weight (lbs) 250 lb 255 lb  Weight (kg) 113.399 kg 115.667 kg     Body mass index is 36.92 kg/m.  General:  Well nourished, well developed, in no acute distress HEENT: normal Lymph: no adenopathy Neck: no JVD Endocrine:  No thryomegaly Vascular: No carotid bruits; FA pulses 2+ bilaterally without bruits  Cardiac:  normal S1, S2; RRR; no  murmur Lungs:  clear to auscultation bilaterally, no wheezing, rhonchi or rales  Abd: soft, nontender, no hepatomegaly  Ext: no edema Musculoskeletal:  No deformities, BUE and BLE strength normal and equal Skin: warm and dry  Neuro:  CNs 2-12 intact, no focal abnormalities noted Psych:  Normal affect   EKG:  The EKG was personally reviewed and demonstrates:  sinus tach HR 104, IRBBB Telemetry:  Telemetry was personally reviewed and demonstrates:  sinus with some episodes of sinus tahchy  Relevant CV Studies: MRI 09/05/21 MRI HEAD IMPRESSION: 1. 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 2. Otherwise normal brain MRI. 3. Moderate paranasal sinus disease with associated right  mastoid effusion.   MRA HEAD IMPRESSION:   Normal intracranial MRA. No large vessel occlusion, hemodynamically significant stenosis, or other acute vascular abnormality.   MRA NECK IMPRESSION:   1. Normal MRA of the neck with wide patency of both carotid artery systems. 2. 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 Dominant left vertebral artery widely patent.    __________________________   09/05/21 CT angio neck/head IMPRESSION: 1. Normal CTA Head and Neck, with normal anatomic variation of the anterior and posterior circulation.   2. Small right frontal lobe posterior cortical infarct is not apparent by CT. No new intracranial abnormality.   _________________________  Echo with bubble 09/05/21 IMPRESSIONS  1. Left ventricular ejection fraction, by estimation, is 60 to 65%. The  left ventricle has normal function. The left ventricle has no regional  wall motion abnormalities. Left ventricular diastolic parameters were  normal.   2. Right ventricular systolic function is normal. The right ventricular  size is normal. There is normal pulmonary artery systolic pressure.   3. Left atrial size was mildly  dilated.   4. The mitral valve is abnormal. Trivial mitral valve regurgitation. No  evidence of mitral stenosis.   5. The aortic valve is normal in structure. Aortic valve regurgitation is  not visualized. No aortic stenosis is present.   6. The inferior vena cava is normal in size with greater than 50%  respiratory variability, suggesting right atrial pressure of 3 mmHg.   7. Agitated saline contrast bubble study was negative, with no evidence  of any interatrial shunt.   ___________________________  TEE 09/06/21 IMPRESSIONS  1. PFO present.   2. Left ventricular ejection fraction, by estimation, is 60 to 65%. The  left ventricle has normal function. The left ventricle has no regional  wall motion abnormalities.   3. Right ventricular systolic function is normal. The right ventricular  size is normal.   4. No left atrial/left atrial appendage thrombus was detected.   5. The mitral valve is normal in structure. No evidence of mitral valve  regurgitation. No evidence of mitral stenosis.   6. The aortic valve is normal in structure. Aortic valve regurgitation is  not visualized. No aortic stenosis is present.   7. The inferior vena cava is normal in size with greater than 50%  respiratory variability, suggesting right atrial pressure of 3 mmHg.   8. Evidence of atrial level shunting detected by color flow Doppler.  There is a moderately sized patent foramen ovale with predominantly right  to left shunting across the atrial septum.   Laboratory Data:  High Sensitivity Troponin:  No results for input(s): TROPONINIHS in the last 720 hours.   Chemistry Recent Labs  Lab 09/04/21 1650 09/04/21 1706 09/06/21 0351 09/07/21 0355  NA 141 141 136 136  K 3.6 3.5 3.9 4.4  CL 104 105 105 105  CO2 23  --  23 23  GLUCOSE 97 101* 115* 111*  BUN 10 10 13 14   CREATININE 1.12 0.90 1.03 1.23  CALCIUM 9.4  --  9.2 9.0  GFRNONAA >60  --  >60 >60  ANIONGAP 14  --  8 8    Recent Labs  Lab  09/04/21 1650 09/06/21 0351 09/07/21 0355  PROT 7.6 7.0 6.7  ALBUMIN 4.4 3.9 3.8  AST 52* 28 27  ALT 56* 47* 45*  ALKPHOS 72 73 64  BILITOT 0.5 0.6 0.5   Hematology Recent Labs  Lab 09/04/21 1650 09/04/21 1706 09/06/21 0351 09/07/21 0355  WBC 10.6*  --  9.7 9.7  RBC 5.08  --  4.92 4.97  HGB 15.0 15.3 15.0 15.1  HCT 45.6 45.0 43.5 44.0  MCV 89.8  --  88.4 88.5  MCH 29.5  --  30.5 30.4  MCHC 32.9  --  34.5 34.3  RDW 12.2  --  12.1 11.9  PLT 395  --  321 297   BNP Recent Labs  Lab 09/06/21 0351 09/07/21 0355  BNP 15.7 47.1    DDimer No results for input(s): DDIMER in the last 168 hours.   Radiology/Studies:  CT ANGIO HEAD W OR WO CONTRAST  Result Date: 09/05/2021 CLINICAL DATA:  31 year old male with tiny posterior right MCA cortical infarct on brain MRI this morning. EXAM: CT ANGIOGRAPHY HEAD AND NECK TECHNIQUE: Multidetector CT imaging of the head and neck was performed using the standard protocol during bolus administration of intravenous contrast. Multiplanar CT image reconstructions and MIPs were obtained to evaluate the vascular anatomy. Carotid stenosis measurements (when applicable) are obtained utilizing NASCET criteria, using the distal internal carotid diameter as the denominator. RADIATION DOSE REDUCTION: This exam was performed according to the departmental dose-optimization program which includes automated exposure control, adjustment of the mA and/or kV according to patient size and/or use of iterative reconstruction technique. CONTRAST:  66mL OMNIPAQUE IOHEXOL 350 MG/ML SOLN COMPARISON:  Brain MRI, MRA head and neck 0110 hours today. Head CT yesterday. FINDINGS: CT HEAD Brain: Small posterior right frontal lobe cortical infarct is not apparent by CT. No midline shift, ventriculomegaly, mass effect, evidence of mass lesion, intracranial hemorrhage. Gray-white matter differentiation is within normal limits throughout the brain. Normal cerebral volume. Calvarium and  skull base: No acute osseous abnormality identified. Paranasal sinuses: Stable maxillary sinus mucosal thickening and/or retention cysts. Stable scattered mild ethmoid mucosal thickening. Tympanic cavities are clear. Stable right mastoid effusion. Orbits: Visualized orbits and scalp soft tissues are within normal limits. CTA NECK Skeleton: No osseous abnormality identified. Upper chest: Negative. Other neck: Negative. Aortic arch: 3 vessel arch configuration. No arch atherosclerosis. Right carotid system: Mildly obscured right CCA origin due to dense right subclavian venous contrast streak artifact. Otherwise the right CCA, right carotid bifurcation and cervical right ICA appear normal. Left carotid system: Normal. Vertebral arteries: Right subclavian artery origin obscured by dense right subclavian venous contrast. Non dominant right vertebral artery origin appears normal. 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 The right vertebral is patent to the skull base with no stenosis identified. Normal proximal left subclavian artery dominant left vertebral artery origin, and cervical left vertebral artery. CTA HEAD Posterior circulation: Left vertebral artery supplies the basilar. Diminutive right V4 segment terminates in PICA. No distal vertebral or basilar stenosis. Patent SCA and PCA origins. Fetal type right PCA origin. Left posterior communicating artery diminutive or absent. Bilateral PCA branches are within normal limits. Anterior circulation: Both ICA siphons are patent. No siphon plaque or stenosis identified. Right siphon appears somewhat dominant. Normal right posterior communicating artery origin. Patent carotid termini, MCA and ACA origins. Mildly dominant right ACA A1. Normal anterior communicating artery. Bilateral ACA branches are tortuous but otherwise within normal limits. Left MCA M1 segment,  bifurcation, and left MCA branches are within normal limits. Right MCA M1 segment and bifurcation are patent without stenosis. Right MCA branches are within normal limits. Venous sinuses: Early contrast timing but grossly patent. Anatomic variants: Dominant left vertebral artery supplies the basilar. Diminutive  right vertebral terminates in PICA. Fetal type right PCA origin and mildly dominant right ACA A1. Review of the MIP images confirms the above findings IMPRESSION: 1. Normal CTA Head and Neck, with normal anatomic variation of the anterior and posterior circulation. 2. Small right frontal lobe posterior cortical infarct is not apparent by CT. No new intracranial abnormality. Electronically Signed   By: Genevie Ann M.D.   On: 09/05/2021 07:43   CT HEAD WO CONTRAST  Result Date: 09/04/2021 CLINICAL DATA:  Numbness or tingling, paresthesia (Ped 0-17y). Left-sided weakness, numbness EXAM: CT HEAD WITHOUT CONTRAST TECHNIQUE: Contiguous axial images were obtained from the base of the skull through the vertex without intravenous contrast. RADIATION DOSE REDUCTION: This exam was performed according to the departmental dose-optimization program which includes automated exposure control, adjustment of the mA and/or kV according to patient size and/or use of iterative reconstruction technique. COMPARISON:  None. FINDINGS: Brain: No acute intracranial abnormality. Specifically, no hemorrhage, hydrocephalus, mass lesion, acute infarction, or significant intracranial injury. Vascular: No hyperdense vessel or unexpected calcification. Skull: No acute calvarial abnormality. Sinuses/Orbits: No acute findings. Mucosal thickening in the maxillary sinuses. Other: None IMPRESSION: No acute intracranial abnormality. Chronic maxillary sinusitis. Electronically Signed   By: Rolm Baptise M.D.   On: 09/04/2021 17:49   CT ANGIO NECK W OR WO CONTRAST  Result Date: 09/05/2021 CLINICAL DATA:  31 year old male with tiny posterior right  MCA cortical infarct on brain MRI this morning. EXAM: CT ANGIOGRAPHY HEAD AND NECK TECHNIQUE: Multidetector CT imaging of the head and neck was performed using the standard protocol during bolus administration of intravenous contrast. Multiplanar CT image reconstructions and MIPs were obtained to evaluate the vascular anatomy. Carotid stenosis measurements (when applicable) are obtained utilizing NASCET criteria, using the distal internal carotid diameter as the denominator. RADIATION DOSE REDUCTION: This exam was performed according to the departmental dose-optimization program which includes automated exposure control, adjustment of the mA and/or kV according to patient size and/or use of iterative reconstruction technique. CONTRAST:  18mL OMNIPAQUE IOHEXOL 350 MG/ML SOLN COMPARISON:  Brain MRI, MRA head and neck 0110 hours today. Head CT yesterday. FINDINGS: CT HEAD Brain: Small posterior right frontal lobe cortical infarct is not apparent by CT. No midline shift, ventriculomegaly, mass effect, evidence of mass lesion, intracranial hemorrhage. Gray-white matter differentiation is within normal limits throughout the brain. Normal cerebral volume. Calvarium and skull base: No acute osseous abnormality identified. Paranasal sinuses: Stable maxillary sinus mucosal thickening and/or retention cysts. Stable scattered mild ethmoid mucosal thickening. Tympanic cavities are clear. Stable right mastoid effusion. Orbits: Visualized orbits and scalp soft tissues are within normal limits. CTA NECK Skeleton: No osseous abnormality identified. Upper chest: Negative. Other neck: Negative. Aortic arch: 3 vessel arch configuration. No arch atherosclerosis. Right carotid system: Mildly obscured right CCA origin due to dense right subclavian venous contrast streak artifact. Otherwise the right CCA, right carotid bifurcation and cervical right ICA appear normal. Left carotid system: Normal. Vertebral arteries: Right subclavian artery  origin obscured by dense right subclavian venous contrast. Non dominant right vertebral artery origin appears normal. 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 The right vertebral is patent to the skull base with no stenosis identified. Normal proximal left subclavian artery dominant left vertebral artery origin, and cervical left vertebral artery. CTA HEAD Posterior circulation: Left vertebral artery supplies the basilar. Diminutive right V4 segment terminates in PICA. No distal vertebral or basilar stenosis. Patent  SCA and PCA origins. Fetal type right PCA origin. Left posterior communicating artery diminutive or absent. Bilateral PCA branches are within normal limits. Anterior circulation: Both ICA siphons are patent. No siphon plaque or stenosis identified. Right siphon appears somewhat dominant. Normal right posterior communicating artery origin. Patent carotid termini, MCA and ACA origins. Mildly dominant right ACA A1. Normal anterior communicating artery. Bilateral ACA branches are tortuous but otherwise within normal limits. Left MCA M1 segment, bifurcation, and left MCA branches are within normal limits. Right MCA M1 segment and bifurcation are patent without stenosis. Right MCA branches are within normal limits. Venous sinuses: Early contrast timing but grossly patent. Anatomic variants: Dominant left vertebral artery supplies the basilar. Diminutive right vertebral terminates in PICA. Fetal type right PCA origin and mildly dominant right ACA A1. Review of the MIP images confirms the above findings IMPRESSION: 1. Normal CTA Head and Neck, with normal anatomic variation of the anterior and posterior circulation. 2. Small right frontal lobe posterior cortical infarct is not apparent by CT. No new intracranial abnormality. Electronically Signed   By: Genevie Ann M.D.   On: 09/05/2021 07:43   MR ANGIO  HEAD WO CONTRAST  Result Date: 09/05/2021 CLINICAL DATA:  Initial evaluation for neuro deficit, stroke suspected, left-sided weakness and numbness. EXAM: MRI HEAD WITHOUT CONTRAST MRA HEAD WITHOUT CONTRAST MRA NECK WITHOUT AND WITH CONTRAST TECHNIQUE: Multiplanar, multi-echo pulse sequences of the brain and surrounding structures were acquired without intravenous contrast. Angiographic images of the Circle of Willis were acquired using MRA technique without intravenous contrast. Angiographic images of the neck were acquired using MRA technique without and with intravenous contrast. Carotid stenosis measurements (when applicable) are obtained utilizing NASCET criteria, using the distal internal carotid diameter as the denominator. CONTRAST:  10 cc of Gadavist. COMPARISON:  Prior head CT from 09/04/2021. FINDINGS: MRI HEAD FINDINGS Brain: Cerebral volume within normal limits. No significant cerebral white matter disease for age. 6 mm focus of restricted diffusion seen involving the cortical gray matter of the right parietal lobe, postcentral gyrus, consistent with an acute ischemic infarct (series 5, image 88). No associated hemorrhage or mass effect. No other evidence for acute or subacute ischemia. Gray-white matter differentiation otherwise maintained. No other areas of encephalomalacia to suggest chronic cortical infarction. No other acute or chronic intracranial blood products. No mass lesion, midline shift or mass effect no hydrocephalus or extra-axial fluid collection. Pituitary gland suprasellar region normal. Midline structures intact. Vascular: Major intracranial vascular flow voids are maintained. Skull and upper cervical spine: Craniocervical junction within normal limits. Bone marrow signal intensity normal. No scalp soft tissue abnormality. Sinuses/Orbits: Globes and orbital soft tissues within normal limits. Moderate mucosal thickening noted throughout the ethmoidal air cells and maxillary sinuses.  Prominent right mastoid effusion. Visualized nasopharynx unremarkable. Other: None. MRA HEAD FINDINGS Anterior circulation: Visualized distal cervical segments of the internal carotid arteries are widely patent with antegrade flow. Petrous, cavernous, and supraclinoid segments patent without stenosis or other abnormality. A1 segments patent. Normal anterior communicating artery complex. Anterior cerebral arteries widely patent without stenosis. No M1 stenosis or occlusion. Normal MCA bifurcations. Distal MCA branches well perfused and symmetric. Posterior circulation: Left vertebral artery dominant and widely patent. Partially visualized right vertebral artery hypoplastic and terminates in PICA. Both PICA patent. Basilar widely patent to its distal aspect without stenosis. Superior cerebellar arteries patent bilaterally. Left PCA supplied via the basilar. Right PCA supplied via the basilar as well as a robust right posterior communicating artery. Both PCAs well perfused  or distal aspects. Anatomic variants: Hypoplastic right vertebral artery terminates in PICA. No aneurysm. MRA NECK FINDINGS Aortic arch: Visualized aortic arch normal caliber with normal branch pattern. No stenosis or other abnormality about the origin the great vessels. Right carotid system: Right common and internal carotid arteries patent without stenosis or evidence for dissection. No atheromatous narrowing or irregularity about the right carotid bulb. Left carotid system: Left common and internal carotid arteries patent without stenosis or evidence for dissection. No atheromatous irregularity or narrowing about the left carotid bulb. Vertebral arteries: Both vertebral arteries arise from the subclavian arteries. Left vertebral artery strongly dominant, with a diffusely hypoplastic right vertebral artery. Vertebral arteries patent without stenosis or dissection. Hypoplastic right vertebral artery terminates in PICA. Other: None IMPRESSION: MRI  HEAD IMPRESSION: 1. 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 2. Otherwise normal brain MRI. 3. Moderate paranasal sinus disease with associated right mastoid effusion. MRA HEAD IMPRESSION: Normal intracranial MRA. No large vessel occlusion, hemodynamically significant stenosis, or other acute vascular abnormality. MRA NECK IMPRESSION: 1. Normal MRA of the neck with wide patency of both carotid artery systems. 2. 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 Dominant left vertebral artery widely patent. Electronically Signed   By: Jeannine Boga M.D.   On: 09/05/2021 02:46   MR Angiogram Neck W or Wo Contrast  Result Date: 09/05/2021 CLINICAL DATA:  Initial evaluation for neuro deficit, stroke suspected, left-sided weakness and numbness. EXAM: MRI HEAD WITHOUT CONTRAST MRA HEAD WITHOUT CONTRAST MRA NECK WITHOUT AND WITH CONTRAST TECHNIQUE: Multiplanar, multi-echo pulse sequences of the brain and surrounding structures were acquired without intravenous contrast. Angiographic images of the Circle of Willis were acquired using MRA technique without intravenous contrast. Angiographic images of the neck were acquired using MRA technique without and with intravenous contrast. Carotid stenosis measurements (when applicable) are obtained utilizing NASCET criteria, using the distal internal carotid diameter as the denominator. CONTRAST:  10 cc of Gadavist. COMPARISON:  Prior head CT from 09/04/2021. FINDINGS: MRI HEAD FINDINGS Brain: Cerebral volume within normal limits. No significant cerebral white matter disease for age. 6 mm focus of restricted diffusion seen involving the cortical gray matter of the right parietal lobe, postcentral gyrus, consistent with an acute ischemic infarct (series 5, image 88). No associated hemorrhage or mass effect. No other evidence for acute or subacute ischemia. Gray-white matter differentiation  otherwise maintained. No other areas of encephalomalacia to suggest chronic cortical infarction. No other acute or chronic intracranial blood products. No mass lesion, midline shift or mass effect no hydrocephalus or extra-axial fluid collection. Pituitary gland suprasellar region normal. Midline structures intact. Vascular: Major intracranial vascular flow voids are maintained. Skull and upper cervical spine: Craniocervical junction within normal limits. Bone marrow signal intensity normal. No scalp soft tissue abnormality. Sinuses/Orbits: Globes and orbital soft tissues within normal limits. Moderate mucosal thickening noted throughout the ethmoidal air cells and maxillary sinuses. Prominent right mastoid effusion. Visualized nasopharynx unremarkable. Other: None. MRA HEAD FINDINGS Anterior circulation: Visualized distal cervical segments of the internal carotid arteries are widely patent with antegrade flow. Petrous, cavernous, and supraclinoid segments patent without stenosis or other abnormality. A1 segments patent. Normal anterior communicating artery complex. Anterior cerebral arteries widely patent without stenosis. No M1 stenosis or occlusion. Normal MCA bifurcations. Distal MCA branches well perfused and symmetric. Posterior circulation: Left vertebral artery dominant and widely patent. Partially visualized right vertebral artery hypoplastic and terminates in PICA. Both PICA patent. Basilar widely patent  to its distal aspect without stenosis. Superior cerebellar arteries patent bilaterally. Left PCA supplied via the basilar. Right PCA supplied via the basilar as well as a robust right posterior communicating artery. Both PCAs well perfused or distal aspects. Anatomic variants: Hypoplastic right vertebral artery terminates in PICA. No aneurysm. MRA NECK FINDINGS Aortic arch: Visualized aortic arch normal caliber with normal branch pattern. No stenosis or other abnormality about the origin the great vessels.  Right carotid system: Right common and internal carotid arteries patent without stenosis or evidence for dissection. No atheromatous narrowing or irregularity about the right carotid bulb. Left carotid system: Left common and internal carotid arteries patent without stenosis or evidence for dissection. No atheromatous irregularity or narrowing about the left carotid bulb. Vertebral arteries: Both vertebral arteries arise from the subclavian arteries. Left vertebral artery strongly dominant, with a diffusely hypoplastic right vertebral artery. Vertebral arteries patent without stenosis or dissection. Hypoplastic right vertebral artery terminates in PICA. Other: None IMPRESSION: MRI HEAD IMPRESSION: 1. 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 2. Otherwise normal brain MRI. 3. Moderate paranasal sinus disease with associated right mastoid effusion. MRA HEAD IMPRESSION: Normal intracranial MRA. No large vessel occlusion, hemodynamically significant stenosis, or other acute vascular abnormality. MRA NECK IMPRESSION: 1. Normal MRA of the neck with wide patency of both carotid artery systems. 2. 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 Dominant left vertebral artery widely patent. Electronically Signed   By: Jeannine Boga M.D.   On: 09/05/2021 02:46   MR BRAIN WO CONTRAST  Result Date: 09/05/2021 CLINICAL DATA:  Initial evaluation for neuro deficit, stroke suspected, left-sided weakness and numbness. EXAM: MRI HEAD WITHOUT CONTRAST MRA HEAD WITHOUT CONTRAST MRA NECK WITHOUT AND WITH CONTRAST TECHNIQUE: Multiplanar, multi-echo pulse sequences of the brain and surrounding structures were acquired without intravenous contrast. Angiographic images of the Circle of Willis were acquired using MRA technique without intravenous contrast. Angiographic images of the neck were acquired using MRA technique without and with  intravenous contrast. Carotid stenosis measurements (when applicable) are obtained utilizing NASCET criteria, using the distal internal carotid diameter as the denominator. CONTRAST:  10 cc of Gadavist. COMPARISON:  Prior head CT from 09/04/2021. FINDINGS: MRI HEAD FINDINGS Brain: Cerebral volume within normal limits. No significant cerebral white matter disease for age. 6 mm focus of restricted diffusion seen involving the cortical gray matter of the right parietal lobe, postcentral gyrus, consistent with an acute ischemic infarct (series 5, image 88). No associated hemorrhage or mass effect. No other evidence for acute or subacute ischemia. Gray-white matter differentiation otherwise maintained. No other areas of encephalomalacia to suggest chronic cortical infarction. No other acute or chronic intracranial blood products. No mass lesion, midline shift or mass effect no hydrocephalus or extra-axial fluid collection. Pituitary gland suprasellar region normal. Midline structures intact. Vascular: Major intracranial vascular flow voids are maintained. Skull and upper cervical spine: Craniocervical junction within normal limits. Bone marrow signal intensity normal. No scalp soft tissue abnormality. Sinuses/Orbits: Globes and orbital soft tissues within normal limits. Moderate mucosal thickening noted throughout the ethmoidal air cells and maxillary sinuses. Prominent right mastoid effusion. Visualized nasopharynx unremarkable. Other: None. MRA HEAD FINDINGS Anterior circulation: Visualized distal cervical segments of the internal carotid arteries are widely patent with antegrade flow. Petrous, cavernous, and supraclinoid segments patent without stenosis or other abnormality. A1 segments patent. Normal anterior communicating artery complex. Anterior cerebral arteries widely patent without stenosis. No M1 stenosis or occlusion. Normal MCA  bifurcations. Distal MCA branches well perfused and symmetric. Posterior  circulation: Left vertebral artery dominant and widely patent. Partially visualized right vertebral artery hypoplastic and terminates in PICA. Both PICA patent. Basilar widely patent to its distal aspect without stenosis. Superior cerebellar arteries patent bilaterally. Left PCA supplied via the basilar. Right PCA supplied via the basilar as well as a robust right posterior communicating artery. Both PCAs well perfused or distal aspects. Anatomic variants: Hypoplastic right vertebral artery terminates in PICA. No aneurysm. MRA NECK FINDINGS Aortic arch: Visualized aortic arch normal caliber with normal branch pattern. No stenosis or other abnormality about the origin the great vessels. Right carotid system: Right common and internal carotid arteries patent without stenosis or evidence for dissection. No atheromatous narrowing or irregularity about the right carotid bulb. Left carotid system: Left common and internal carotid arteries patent without stenosis or evidence for dissection. No atheromatous irregularity or narrowing about the left carotid bulb. Vertebral arteries: Both vertebral arteries arise from the subclavian arteries. Left vertebral artery strongly dominant, with a diffusely hypoplastic right vertebral artery. Vertebral arteries patent without stenosis or dissection. Hypoplastic right vertebral artery terminates in PICA. Other: None IMPRESSION: MRI HEAD IMPRESSION: 1. 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 2. Otherwise normal brain MRI. 3. Moderate paranasal sinus disease with associated right mastoid effusion. MRA HEAD IMPRESSION: Normal intracranial MRA. No large vessel occlusion, hemodynamically significant stenosis, or other acute vascular abnormality. MRA NECK IMPRESSION: 1. Normal MRA of the neck with wide patency of both carotid artery systems. 2. 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 Dominant left  vertebral artery widely patent. Electronically Signed   By: Jeannine Boga M.D.   On: 09/05/2021 02:46   ECHO TEE  Result Date: 09/06/2021    TRANSESOPHOGEAL ECHO REPORT   Patient Name:   Bobby Haynes Date of Exam: 09/06/2021 Medical Rec #:  [id number]          Height:       69.0 in Accession #:    [id number]         Weight:       250.0 lb Date of Birth:  1990/12/15          BSA:          2.272 m Patient Age:    30 years           BP:           152/99 mmHg Patient Gender: M                  HR:           99 bpm. Exam Location:  Inpatient Procedure: Transesophageal Echo, 3D Echo, Cardiac Doppler, Color Doppler and            Saline Contrast Bubble Study Indications:     Cerebral Infarction, unspecified I63.9  History:         Patient has prior history of Echocardiogram examinations, most                  recent 09/05/2021. GERD.  Sonographer:     Darlina Sicilian RDCS Referring Phys:  [id number] Thana Farr SKAINS Diagnosing Phys: Candee Furbish MD PROCEDURE: After discussion of the risks and benefits of a TEE, an informed consent was obtained from the patient. TEE procedure time was 7 minutes. The transesophogeal probe was passed without difficulty through the esophogus of the patient. Imaged were  obtained with  the patient in a left lateral decubitus position. Local oropharyngeal anesthetic was provided with Cetacaine. Sedation performed by different physician. The patient was monitored while under deep sedation. Anesthestetic sedation was provided intravenously by Anesthesiology: 374.84mg  of Propofol, 100mg  of Lidocaine. Image quality was good. The patient's vital signs; including heart rate, blood pressure, and oxygen saturation; remained stable throughout the procedure. The patient developed no complications during the procedure. IMPRESSIONS  1. PFO present.  2. Left ventricular ejection fraction, by estimation, is 60 to 65%. The left ventricle has normal function. The left ventricle has no regional wall motion  abnormalities.  3. Right ventricular systolic function is normal. The right ventricular size is normal.  4. No left atrial/left atrial appendage thrombus was detected.  5. The mitral valve is normal in structure. No evidence of mitral valve regurgitation. No evidence of mitral stenosis.  6. The aortic valve is normal in structure. Aortic valve regurgitation is not visualized. No aortic stenosis is present.  7. The inferior vena cava is normal in size with greater than 50% respiratory variability, suggesting right atrial pressure of 3 mmHg.  8. Evidence of atrial level shunting detected by color flow Doppler. There is a moderately sized patent foramen ovale with predominantly right to left shunting across the atrial septum. FINDINGS  Left Ventricle: Left ventricular ejection fraction, by estimation, is 60 to 65%. The left ventricle has normal function. The left ventricle has no regional wall motion abnormalities. The left ventricular internal cavity size was normal in size. There is  no left ventricular hypertrophy. Right Ventricle: The right ventricular size is normal. No increase in right ventricular wall thickness. Right ventricular systolic function is normal. Left Atrium: Left atrial size was normal in size. No left atrial/left atrial appendage thrombus was detected. Right Atrium: Right atrial size was normal in size. Pericardium: There is no evidence of pericardial effusion. Mitral Valve: The mitral valve is normal in structure. No evidence of mitral valve regurgitation. No evidence of mitral valve stenosis. Tricuspid Valve: The tricuspid valve is normal in structure. Tricuspid valve regurgitation is not demonstrated. No evidence of tricuspid stenosis. Aortic Valve: The aortic valve is normal in structure. Aortic valve regurgitation is not visualized. No aortic stenosis is present. Pulmonic Valve: The pulmonic valve was normal in structure. Pulmonic valve regurgitation is not visualized. No evidence of pulmonic  stenosis. Aorta: The aortic root is normal in size and structure. Venous: The inferior vena cava is normal in size with greater than 50% respiratory variability, suggesting right atrial pressure of 3 mmHg. IAS/Shunts: Evidence of atrial level shunting detected by color flow Doppler. Agitated saline contrast was given intravenously to evaluate for intracardiac shunting. A moderately sized patent foramen ovale is detected with predominantly right to left shunting across the atrial septum. Additional Comments: PFO present.  TRICUSPID VALVE TR Peak grad:   21.3 mmHg TR Vmax:        231.00 cm/s Donato Schultz MD Electronically signed by Donato Schultz MD Signature Date/Time: 09/06/2021/1:03:12 PM    Final    ECHOCARDIOGRAM COMPLETE BUBBLE STUDY  Result Date: 09/05/2021    ECHOCARDIOGRAM REPORT   Patient Name:   Bobby Haynes Date of Exam: 09/05/2021 Medical Rec #:  [id number]          Height:       69.0 in Accession #:    [id number]         Weight:       255.0 lb Date of Birth:  04/27/1991  BSA:          2.291 m Patient Age:    30 years           BP:           137/77 mmHg Patient Gender: M                  HR:           85 bpm. Exam Location:  Inpatient Procedure: 2D Echo, Cardiac Doppler and Color Doppler Indications:    Stroke  History:        Patient has no prior history of Echocardiogram examinations.  Sonographer:    Clayton Lefort RDCS (AE) Referring Phys: [id number]intosh [facility]  Sonographer Comments: Patient is morbidly obese. IMPRESSIONS  1. Left ventricular ejection fraction, by estimation, is 60 to 65%. The left ventricle has normal function. The left ventricle has no regional wall motion abnormalities. Left ventricular diastolic parameters were normal.  2. Right ventricular systolic function is normal. The right ventricular size is normal. There is normal pulmonary artery systolic pressure.  3. Left atrial size was mildly dilated.  4. The mitral valve is abnormal. Trivial mitral valve regurgitation. No  evidence of mitral stenosis.  5. The aortic valve is normal in structure. Aortic valve regurgitation is not visualized. No aortic stenosis is present.  6. The inferior vena cava is normal in size with greater than 50% respiratory variability, suggesting right atrial pressure of 3 mmHg.  7. Agitated saline contrast bubble study was negative, with no evidence of any interatrial shunt. FINDINGS  Left Ventricle: Left ventricular ejection fraction, by estimation, is 60 to 65%. The left ventricle has normal function. The left ventricle has no regional wall motion abnormalities. The left ventricular internal cavity size was normal in size. There is  no left ventricular hypertrophy. Left ventricular diastolic parameters were normal. Right Ventricle: The right ventricular size is normal. No increase in right ventricular wall thickness. Right ventricular systolic function is normal. There is normal pulmonary artery systolic pressure. The tricuspid regurgitant velocity is 2.09 m/s, and  with an assumed right atrial pressure of 3 mmHg, the estimated right ventricular systolic pressure is 123XX123 mmHg. Left Atrium: Left atrial size was mildly dilated. Right Atrium: Right atrial size was normal in size. Pericardium: There is no evidence of pericardial effusion. Mitral Valve: The mitral valve is abnormal. There is mild thickening of the mitral valve leaflet(s). There is mild calcification of the mitral valve leaflet(s). Trivial mitral valve regurgitation. No evidence of mitral valve stenosis. Tricuspid Valve: The tricuspid valve is normal in structure. Tricuspid valve regurgitation is mild . No evidence of tricuspid stenosis. Aortic Valve: The aortic valve is normal in structure. Aortic valve regurgitation is not visualized. No aortic stenosis is present. Aortic valve mean gradient measures 3.0 mmHg. Aortic valve peak gradient measures 5.8 mmHg. Aortic valve area, by VTI measures 2.78 cm. Pulmonic Valve: The pulmonic valve was  normal in structure. Pulmonic valve regurgitation is not visualized. No evidence of pulmonic stenosis. Aorta: The aortic root is normal in size and structure. Venous: The inferior vena cava is normal in size with greater than 50% respiratory variability, suggesting right atrial pressure of 3 mmHg. IAS/Shunts: No atrial level shunt detected by color flow Doppler. Agitated saline contrast bubble study was negative, with no evidence of any interatrial shunt.  LEFT VENTRICLE PLAX 2D LVIDd:         5.20 cm   Diastology LVIDs:  3.20 cm   LV e' medial:    11.70 cm/s LV PW:         1.30 cm   LV E/e' medial:  9.2 LV IVS:        0.90 cm   LV e' lateral:   13.30 cm/s LVOT diam:     2.10 cm   LV E/e' lateral: 8.1 LV SV:         63 LV SV Index:   28 LVOT Area:     3.46 cm  RIGHT VENTRICLE             IVC RV Basal diam:  3.40 cm     IVC diam: 1.70 cm RV S prime:     13.10 cm/s TAPSE (M-mode): 2.2 cm LEFT ATRIUM           Index        RIGHT ATRIUM           Index LA diam:      4.00 cm 1.75 cm/m   RA Area:     13.40 cm LA Vol (A4C): 59.2 ml 25.84 ml/m  RA Volume:   29.50 ml  12.88 ml/m  AORTIC VALVE AV Area (Vmax):    2.77 cm AV Area (Vmean):   2.78 cm AV Area (VTI):     2.78 cm AV Vmax:           120.00 cm/s AV Vmean:          79.100 cm/s AV VTI:            0.227 m AV Peak Grad:      5.8 mmHg AV Mean Grad:      3.0 mmHg LVOT Vmax:         95.90 cm/s LVOT Vmean:        63.500 cm/s LVOT VTI:          0.182 m LVOT/AV VTI ratio: 0.80  AORTA Ao Root diam: 3.50 cm Ao Asc diam:  3.10 cm MITRAL VALVE                TRICUSPID VALVE MV Area (PHT): 4.96 cm     TR Peak grad:   17.5 mmHg MV Decel Time: 153 msec     TR Vmax:        209.00 cm/s MV E velocity: 108.00 cm/s MV A velocity: 75.80 cm/s   SHUNTS MV E/A ratio:  1.42         Systemic VTI:  0.18 m                             Systemic Diam: 2.10 cm Jenkins Rouge MD Electronically signed by Jenkins Rouge MD Signature Date/Time: 09/05/2021/11:49:48 AM    Final    VAS [place] LOWER  EXTREMITY VENOUS (DVT)  Result Date: 09/05/2021  Lower Venous DVT Study Patient Name:  Bobby Haynes  Date of Exam:   09/05/2021 Medical Rec #: [id number]           Accession #:    [id number] Date of Birth: 26-May-1991           Patient Gender: M Patient Age:   63 years Exam Location:  [facility], [place] Procedure:      VAS [place] LOWER EXTREMITY VENOUS (DVT) Referring Phys: MCNEILL KIRKPATRICK --------------------------------------------------------------------------------  Indications: Stroke.  Comparison Study: No prior study on file Performing Technologist: Sharion Dove RVS  Examination Guidelines: A complete evaluation  includes B-mode imaging, spectral Doppler, color Doppler, and power Doppler as needed of all accessible portions of each vessel. Bilateral testing is considered an integral part of a complete examination. Limited examinations for reoccurring indications may be performed as noted. The reflux portion of the exam is performed with the patient in reverse Trendelenburg.  +---------+---------------+---------+-----------+----------+--------------+  RIGHT     Compressibility Phasicity Spontaneity Properties Thrombus Aging  +---------+---------------+---------+-----------+----------+--------------+  CFV       Full            Yes       Yes                                    +---------+---------------+---------+-----------+----------+--------------+  SFJ       Full                                                             +---------+---------------+---------+-----------+----------+--------------+  FV Prox   Full                                                             +---------+---------------+---------+-----------+----------+--------------+  FV Mid    Full                                                             +---------+---------------+---------+-----------+----------+--------------+  FV Distal Full                                                              +---------+---------------+---------+-----------+----------+--------------+  PFV       Full                                                             +---------+---------------+---------+-----------+----------+--------------+  POP       Full            Yes       Yes                                    +---------+---------------+---------+-----------+----------+--------------+  PTV       Full                                                             +---------+---------------+---------+-----------+----------+--------------+  PERO      Full                                                             +---------+---------------+---------+-----------+----------+--------------+   +---------+---------------+---------+-----------+----------+---------------+  LEFT      Compressibility Phasicity Spontaneity Properties Thrombus Aging   +---------+---------------+---------+-----------+----------+---------------+  CFV       Full            Yes       Yes                                     +---------+---------------+---------+-----------+----------+---------------+  SFJ       Full                                                              +---------+---------------+---------+-----------+----------+---------------+  FV Prox   Full                                                              +---------+---------------+---------+-----------+----------+---------------+  FV Mid    Full                                                              +---------+---------------+---------+-----------+----------+---------------+  FV Distal Full                                                              +---------+---------------+---------+-----------+----------+---------------+  PFV       Full                                                              +---------+---------------+---------+-----------+----------+---------------+  POP       Full            Yes       Yes                                      +---------+---------------+---------+-----------+----------+---------------+  PTV  patent by color  +---------+---------------+---------+-----------+----------+---------------+  PERO                                                       patent by color  +---------+---------------+---------+-----------+----------+---------------+     Summary: BILATERAL: -No evidence of popliteal cyst, bilaterally. RIGHT: - There is no evidence of deep vein thrombosis in the lower extremity.  LEFT: - There is no evidence of deep vein thrombosis in the lower extremity.  *See table(s) above for measurements and observations. Electronically signed by Deitra Mayo MD on 09/05/2021 at 11:24:42 AM.    Final      Assessment and Plan:   Radiographic results, hospital notes, lab results, and cardiac imaging data are reviewed. TEE images are reviewed and demonstrate EF 60 to 65% with evidence of atrial level shunting by color-flow Doppler as well as a moderately sized PFO with predominantly right to left shunting across the atrial septum.   The patient's Rope Score is 9, indicating an 88% chance (high probability) that the patient's stroke is 'PFO-related' and a corresponding 2% risk of recurrent TIA/CVA  The patient is counseled about the association of PFO and cryptogenic stroke. Available clinical trial data is reviewed, specifically those trials comparing transcatheter PFO closure and medical therapy with antiplatelet drugs. The patient understands the potential benefit of PFO closure with respect to secondary stroke reduction compared with medical therapy alone. Specific risks of transcatheter PFO closure are reviewed with the patient. These risks include bleeding, infection, device embolization, stroke, cardiac perforation, tamponade, arrhythmia, MI, and late device erosion. He understands these serious risks occur at low incidence of < 1%.   We should be able to get  this set up for sometime in February.   For questions or updates, please contact [place] Please consult [email or web address] for contact info under    Signed, Angelena Form, PA-C  09/07/2021 8:56 AM  Patient seen, examined. Available data reviewed. Agree with findings, assessment, and plan as outlined by Nell Range, PA-C.  The patient is independently interviewed and examined.  His wife is at the bedside.  He is alert, oriented, no distress.  HEENT is normal, lungs are clear bilaterally, JVP is normal, carotid upstrokes are normal without bruits, heart is regular rate and rhythm with no murmur or gallop, abdomen is soft and nontender, extremities have no edema, skin is warm and dry without rash, neurologic is grossly intact with 5/5 strength bilaterally.  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  His transesophageal echo confirms a moderate sized PFO.  He has no extracranial vascular disease.  LVEF is normal and there is no valvular heart disease.  His hypercoagulable work-up is either negative or still pending.  The patient's DVT study is negative.  We discussed the association of PFO and cryptogenic stroke.  The patient has a rope score of 9 indicating a high probability that his cryptogenic stroke is actually PFO-related.  We reviewed clinical trial data regarding transcatheter PFO closure.  After review of risks, indications, and alternatives to transcatheter PFO closure, the patient would like to proceed.  He understands that serious risks of bleeding, vascular injury, stroke, myocardial infarction, cardiac injury with tamponade, need for pericardiocentesis, need for emergency cardiac surgery, device embolization, and death, occur at an incidence of less than  1%.  The patient will be scheduled for transcatheter PFO closure at the next available time.  He will continue on his current medical program which includes dual  antiplatelet therapy with aspirin and clopidogrel.  Sherren Mocha, M.D. 09/07/2021 2:02 PM

## 2021-09-07 NOTE — TOC Transition Note (Signed)
Transition of Care [facility]) - CM/SW Discharge Note   Patient Details  Name: Bobby Haynes MRN: [id number] Date of Birth: [birth year]  [facility] CM/SW Contact:  Kermit Balo, RN Phone Number: 09/07/2021, 12:08 PM   Clinical Narrative:    Patient is discharging home with self care. No needs per PT/OT.  Pt has transportation home.    Final next level of care: Home/Self Care Barriers to Discharge: No Barriers Identified   Patient Goals and CMS Choice        Discharge Placement                       Discharge Plan and Services                                     Social Determinants of Health (SDOH) Interventions     Readmission Risk Interventions No flowsheet data found.

## 2021-09-07 NOTE — Telephone Encounter (Signed)
Patient was called to schedule his procedure.  He was seen by Dr. Excell Seltzer while in the hospital and was told that he was going to be scheduled for a PFO.

## 2021-09-08 LAB — CARDIOLIPIN ANTIBODIES, IGG, IGM, IGA
Anticardiolipin IgA: <9 APL U/mL (ref 0–11)
Anticardiolipin IgG: <9 GPL U/mL (ref 0–14)
Anticardiolipin IgM: <9 MPL U/mL (ref 0–12)

## 2021-09-08 LAB — BETA-2-GLYCOPROTEIN I ABS, IGG/M/A
Beta-2 Glyco I IgG: <9 GPI IgG units (ref 0–20)
Beta-2-Glycoprotein I IgA: 20 GPI IgA units (ref 0–25)
Beta-2-Glycoprotein I IgM: <9 GPI IgM units (ref 0–32)

## 2021-09-08 LAB — PHOSPHATIDYLSERINE ANTIBODIES
Phosphatydalserine, IgA: <1 APS Units (ref 0–19)
Phosphatydalserine, IgG: 9 Units (ref 0–30)
Phosphatydalserine, IgM: <10 Units (ref 0–30)

## 2021-09-09 ENCOUNTER — Telehealth: Payer: Self-pay

## 2021-09-09 NOTE — Telephone Encounter (Signed)
Confirmed and reviewed instructions with patient. He was grateful for call and agrees with plan.

## 2021-09-09 NOTE — Telephone Encounter (Signed)
Transition Care Management Follow-up Telephone Call     Date discharged? 09/07/21   How have you been since you were released from the hospital? Pt has had episodes of his arm going weak and getting heavy it only last a few seconds but it goes back to normal it has happened a few times he has also had episodes of getting dizzy. Pt advised that if this happens again to have his wife call EMS or take him to the ER with a new stroke its better to be safe than sorry and go be evaluated     Any patient concerns? in an out arm weakness and dizziness,      Items Reviewed: Medications reviewed: went over pt med list   TAKE these medications     albuterol 108 (90 Base) MCG/ACT inhaler Commonly known as: VENTOLIN HFA INHALE 2 PUFFS BY MOUTH EVERY 6 HOURS AS NEEDED FOR WHEEZING    aspirin 81 MG EC tablet Take 1 tablet (81 mg total) by mouth daily. Swallow whole. Start taking on: September 08, 2021    atorvastatin 40 MG tablet Commonly known as: LIPITOR Take 1 tablet (40 mg total) by mouth daily. Start taking on: September 08, 2021    busPIRone 10 MG tablet Commonly known as: BUSPAR Take 5 mg by mouth in the morning.    Cholecalciferol 25 MCG (1000 UT) tablet     NOT TAKEN  Take 1 tablet by mouth daily.    clopidogrel 75 MG tablet Commonly known as: PLAVIX Take 1 tablet (75 mg total) by mouth daily. Start taking on: September 08, 2021    hydrOXYzine 10 MG tablet Commonly known as: ATARAX Take 10 mg by mouth daily as needed for anxiety.    magnesium 30 MG tablet        NOT TAKEN  Take 30 mg by mouth daily.    montelukast 10 MG tablet Commonly known as: SINGULAIR Take 1 tablet by mouth daily.    MULTIVITAMIN ADULTS PO      NOT TAKEN  Take 3 tablets by mouth daily.    rizatriptan 10 MG disintegrating tablet Commonly known as: Maxalt-MLT Take 1 tablet earliest onset of migraine.  May repeat in 2 hours if needed.  Maximum 2 tablets in 24 hours.    traZODone 50 MG tablet                           PT IS ONLY TAKEN 25MG  AT BEDTIME Commonly known as: DESYREL Take 50 mg by mouth at bedtime.    ZINC PO               NOT TAKEN  Take 1 tablet by mouth daily.    ZYRTEC DAILY   Allergies reviewed: no allergies  Dietary changes reviewed: no change Referrals reviewed: PCP follow up , Dr Follow up, Cardiology      Functional Questionnaire:  Independent - I Dependent - D    Activities of Daily Living (ADLs):     Personal hygiene - I Dressing - I Eating - I Maintaining continence - I Transferring - I   Independent Activities of Daily Living (iADLs): Basic communication skills - I Transportation - D at this time is going to try and drive on Sat with a stick shift  Meal preparation  - I Shopping - I Housework - I Managing medications - I Managing personal finances - I   Confirmed importance and date/time of  follow-up visits scheduled Prt Scheduled with Dr Everlena Cooper 09/14/21 at12:50 Provider Appointment booked with Dr Everlena Cooper   Confirmed with patient if condition begins to worsen call PCP or go to the ER.  Patient was given the office number and encouraged to call back with question or concerns: Yes

## 2021-09-10 LAB — FACTOR 5 LEIDEN

## 2021-09-10 LAB — PROTHROMBIN GENE MUTATION

## 2021-09-13 NOTE — Progress Notes (Deleted)
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  *** 2  Follow up with cardiology for consideration of PFO closure 3  Lipitor 40mg  daily.  LDL goal less than 70. 4  ***  Subjective:  Bobby Haynes is a 31 year old left-handed male whom I have seen for migraines and tension-type headaches, presents today for transition of care from recent hospitalization for stroke.  History supplemented by hospital records. CT/CTA and MRI/MRA personally reviewed.  On 09/04/2021 he developed sudden onset of left sided numbness, weakness, blurred vision and dysarthria lasting 5 minutes.  He went to [facility] ED where CT head showed no acute intracranial abnormality but follow up MRI of brain showed 6 mm acute ischemic cortical infarct involving the right parietal lobe/postcentral gyrus.  CTA head showed no hemodynamically significant stenosis or occlusion.  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  Telemetry revealed no afib.  Hypercoagulable panel was negative.  Hgb A1c was 5.8 and LDL was 158.  2D echo showed EF 60-65% with negative bubble study but TEE was positive for PFO.  He was discharged on ASA 81mg  and Plavix 75mg  daily *** as well as started on Lipitor 40mg  daily.  He is going to follow up with Dr. Burt Knack of cardiology to discuss PFO closure.  ***  PAST MEDICAL HISTORY: Past Medical History:  Diagnosis Date   Depression    GERD (gastroesophageal reflux disease)    Insomnia     MEDICATIONS: Current Outpatient Medications on File Prior to Visit  Medication Sig Dispense Refill   albuterol (VENTOLIN HFA) 108 (90 Base) MCG/ACT inhaler INHALE 2 PUFFS BY MOUTH EVERY 6 HOURS AS NEEDED FOR  WHEEZING     aspirin EC 81 MG EC tablet Take 1 tablet (81 mg total) by mouth daily. Swallow whole. 30 tablet 0   atorvastatin (LIPITOR) 40 MG tablet Take 1 tablet (40 mg total) by mouth daily. 30 tablet 0   busPIRone (BUSPAR) 10 MG tablet Take 5 mg by mouth in the morning.     Cholecalciferol 25 MCG (1000 UT) tablet Take 1 tablet by mouth daily.     clopidogrel (PLAVIX) 75 MG tablet Take 1 tablet (75 mg total) by mouth daily. 30 tablet 0   hydrOXYzine (ATARAX/VISTARIL) 10 MG tablet Take 10 mg by mouth daily as needed for anxiety.     magnesium 30 MG tablet Take 30 mg by mouth daily.     montelukast (SINGULAIR) 10 MG tablet Take 1 tablet by mouth daily.     Multiple Vitamins-Minerals (MULTIVITAMIN ADULTS PO) Take 3 tablets by mouth daily.     Multiple Vitamins-Minerals (ZINC PO) Take 1 tablet by mouth daily.     rizatriptan (MAXALT-MLT) 10 MG disintegrating tablet Take 1 tablet earliest onset of migraine.  May repeat in 2 hours if needed.  Maximum 2 tablets in 24 hours. 10 tablet 5   traZODone (DESYREL) 50 MG tablet Take 50 mg by mouth at bedtime.     No current facility-administered medications on file prior to visit.    ALLERGIES: No Known Allergies  FAMILY HISTORY: Family History  Problem Relation Age of Onset   Dementia Maternal Grandmother    Dementia Maternal Grandfather  Objective:  *** General: No acute distress.  Patient appears well-groomed.   Head:  Normocephalic/atraumatic Eyes:  Fundi examined but not visualized Neck: supple, no paraspinal tenderness, full range of motion Heart:  Regular rate and rhythm Lungs:  Clear to auscultation bilaterally Back: No paraspinal tenderness Neurological Exam: alert and oriented to person, place, and time.  Speech fluent and not dysarthric, language intact.  CN II-XII intact. Bulk and tone normal, muscle strength 5/5 throughout.  Sensation to light touch intact.  Deep tendon reflexes 2+ throughout, toes downgoing.  Finger to nose  testing intact.  Gait normal, Romberg negative.   Metta Clines, DO

## 2021-09-14 ENCOUNTER — Ambulatory Visit: Payer: No Typology Code available for payment source | Admitting: Neurology

## 2021-09-20 NOTE — Progress Notes (Signed)
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  2  Follow up with cardiology for  of PFO closure 3  Lipitor 40mg  daily.  LDL goal less than 70. 4  Tylenol for headaches.  No triptans in setting of stroke.  Subjective:  Bobby Haynes is a 31 year old left-handed male whom I have seen for migraines and tension-type headaches, presents today for transition of care from recent hospitalization for stroke.  History supplemented by hospital records. CT/CTA and MRI/MRA personally reviewed.  On 09/04/2021 he was on the toilet having a bowel movement when he developed blurred vision and blacked out for a couple of seconds.  He woke up with left arm numbness and weakness.  He then developed left sided facial droop and slurred speech.  Symptoms resolved after 5 minutes.  He went to [facility] ED where CT head showed no acute intracranial abnormality but follow up MRI of brain showed 6 mm acute ischemic cortical infarct involving the right parietal lobe/postcentral gyrus.  CTA head showed no hemodynamically significant stenosis or occlusion.  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  Telemetry revealed no afib.  Hypercoagulable panel was negative.  Hgb A1c was 5.8 and LDL was 158.  2D echo showed EF 60-65% with negative bubble study but TEE was positive for PFO.  He was discharged on ASA 81mg  and Plavix 75mg  daily as well as started on Lipitor 40mg  daily.  He is going to follow up with Dr. 38 of cardiology to discuss PFO closure.  Scheduled for closure on 2/22.  When gets fatigued midday body feels weak.  A couple of times face numb and left side felt numb again, likely recrudescence  of previous symptoms.  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      PAST MEDICAL HISTORY: Past Medical History:  Diagnosis Date   Depression    GERD (gastroesophageal reflux disease)    Insomnia     MEDICATIONS: Current Outpatient Medications on File Prior to Visit  Medication Sig Dispense Refill   albuterol (VENTOLIN HFA) 108 (90 Base) MCG/ACT inhaler INHALE 2 PUFFS BY MOUTH EVERY 6 HOURS AS NEEDED FOR WHEEZING     aspirin EC 81 MG EC tablet Take 1 tablet (81 mg total) by mouth daily. Swallow whole. 30 tablet 0   atorvastatin (LIPITOR) 40 MG tablet Take 1 tablet (40 mg total) by mouth daily. 30 tablet 0   busPIRone (BUSPAR) 10 MG tablet Take 5 mg by mouth in the morning.     Cholecalciferol 25 MCG (1000 UT) tablet Take 1 tablet by mouth daily.     clopidogrel (PLAVIX) 75 MG tablet Take 1 tablet (75 mg total) by mouth daily. 30 tablet 0   hydrOXYzine (ATARAX/VISTARIL) 10 MG tablet Take 10 mg by mouth daily as needed for anxiety.     magnesium 30 MG tablet Take 30 mg by mouth daily.     montelukast (SINGULAIR) 10 MG tablet Take 1 tablet by mouth daily.     Multiple Vitamins-Minerals (MULTIVITAMIN ADULTS PO) Take 3 tablets by mouth daily.     Multiple Vitamins-Minerals (ZINC PO) Take 1  tablet by mouth daily.     rizatriptan (MAXALT-MLT) 10 MG disintegrating tablet Take 1 tablet earliest onset of migraine.  May repeat in 2 hours if needed.  Maximum 2 tablets in 24 hours. 10 tablet 5   traZODone (DESYREL) 50 MG tablet Take 50 mg by mouth at bedtime.     No current facility-administered medications on file prior to visit.    ALLERGIES: No Known Allergies  FAMILY HISTORY: Family History  Problem Relation Age of Onset   Dementia Maternal Grandmother    Dementia Maternal Grandfather       Objective:  Blood pressure (!) 136/97, pulse 94, height 5\' 9"  (1.753 m), weight 256 lb 6.4 oz (116.3 kg), SpO2 97 %. General:  No acute distress.  Patient appears well-groomed.   Head:  Normocephalic/atraumatic Eyes:  Fundi examined but not visualized Neck: supple, no paraspinal tenderness, full range of motion Heart:  Regular rate and rhythm Lungs:  Clear to auscultation bilaterally Back: No paraspinal tenderness Neurological Exam: alert and oriented to person, place, and time.  Speech fluent and not dysarthric, language intact.  CN II-XII intact. Bulk and tone normal, muscle strength 5/5 throughout.  Sensation to pinprick and vibration intact.  Deep tendon reflexes 2+ throughout, toes downgoing.  Finger to nose testing intact.  Gait normal, Romberg negative.   , DO

## 2021-09-21 ENCOUNTER — Encounter: Payer: Self-pay | Admitting: Neurology

## 2021-09-21 ENCOUNTER — Other Ambulatory Visit: Payer: Self-pay

## 2021-09-21 ENCOUNTER — Ambulatory Visit (INDEPENDENT_AMBULATORY_CARE_PROVIDER_SITE_OTHER): Payer: Self-pay | Admitting: Neurology

## 2021-09-21 VITALS — BP 136/97 | HR 94 | Ht 69.0 in | Wt 256.4 lb

## 2021-09-21 DIAGNOSIS — Q2112 Patent foramen ovale: Secondary | ICD-10-CM

## 2021-09-21 DIAGNOSIS — G44219 Episodic tension-type headache, not intractable: Secondary | ICD-10-CM

## 2021-09-21 DIAGNOSIS — I639 Cerebral infarction, unspecified: Secondary | ICD-10-CM

## 2021-09-21 DIAGNOSIS — G43109 Migraine with aura, not intractable, without status migrainosus: Secondary | ICD-10-CM

## 2021-09-21 NOTE — Patient Instructions (Addendum)
Continue aspirin, Plavix and atorvastatin Limit use of pain relievers to no more than 2 days out of week to prevent risk of rebound or medication-overuse headache.  Use Tylenol rather than ibuprofen Follow up in 4 months.

## 2021-10-08 ENCOUNTER — Telehealth: Payer: Self-pay | Admitting: Cardiovascular Disease

## 2021-10-08 NOTE — Telephone Encounter (Signed)
[facility] states [facility] was [facility] for his appointment Monday and upcoming procedure. He would like to make sure it was received.

## 2021-10-08 NOTE — H&P (View-Only) (Signed)
[facility]   MULTIDISCIPLINARY HEART VALVE CLINIC                                     Cardiology Office Note:    Date:  10/12/2021   ID:  Bobby Haynes, DOB [birth year], MRN [id number]  PCP:  [facility]  [facility] Cardiologist:  Bobby BollmanMichael Cooper, MD  [facility] Electrophysiologist:  None   Referring MD: [facility]   Chief Complaint  Patient presents with   Follow-up    Pre PFO closure   History of Present Illness:    Bobby Haynes is a 31 y.o. male with a hx of obesity and migraines who was referred to Dr. Excell Seltzerooper for PFO closure.    [facility] for evaluation of transient right-sided numbness, aphagia and blurred vision while having a bowel movement. Head CT showed no acute intracranial abnormality. 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 MRA showed wide patency of both carotid arteries and R vertebral artery diffusely hypoplastic and terminates in PICA, dominant L vertebral artery. CTA showed no abnormal anterior or posterior circulation. Echocardiogram showed an EF 60 to 65% with a negative bubble study. Bilateral lower extremity Dopplers were negative for DVT. Hypercoagulable work-up has been negative. TEE on 09/06/2021 showed EF 60 to 65% with evidence of atrial level shunting by color-flow Doppler as well as a moderately sized PFO with predominantly right to left shunting across the atrial septum.   He was seen by Dr. Excell Seltzerooper with plans to proceed with PFO closure, now scheduled for 10/14/21.  He is here alone today and reports no new neuro changes. All questions answered in regards to his upcoming procedure. He denies chest pain, palpitations, LE edema, dizziness, no new visual changes, and no syncope.  Past Medical History:  Diagnosis Date   Depression    GERD (gastroesophageal reflux disease)    Insomnia      Past Surgical History:  Procedure Laterality Date   BUBBLE STUDY  09/06/2021   Procedure: BUBBLE STUDY;  Surgeon: Jake BatheSkains, Mark C, MD;  Location: [facility];  Service: Cardiovascular;;   TEE WITHOUT CARDIOVERSION N/A 09/06/2021   Procedure: TRANSESOPHAGEAL ECHOCARDIOGRAM (TEE);  Surgeon: Jake BatheSkains, Mark C, MD;  [facility];  Service: Cardiovascular;  Laterality: N/A;    Current Medications: Current Meds  Medication Sig   acetaminophen (TYLENOL) 500 MG tablet Take 1,000 mg by mouth every 6 (six) hours as needed for moderate pain.   albuterol (VENTOLIN HFA) 108 (90 Base) MCG/ACT inhaler Inhale 2 puffs into the lungs every 6 (six) hours as needed for shortness of breath or wheezing.   amoxicillin-clavulanate (AUGMENTIN) 875-125 MG tablet Take 1 tablet by mouth 2 (two) times daily.   aspirin EC 81 MG EC tablet Take 1 tablet (81 mg total) by mouth daily. Swallow whole.   atorvastatin (LIPITOR) 40 MG tablet Take 1 tablet (40 mg total) by mouth daily.   busPIRone (BUSPAR) 10 MG tablet Take 5 mg by mouth 2 (two) times daily.   cetirizine (ZYRTEC) 10 MG tablet Take 10 mg by mouth at bedtime.   ciprofloxacin-dexamethasone (CIPRODEX) OTIC suspension Place 4 drops into the right ear 2 (two) times daily.   clopidogrel (PLAVIX) 75 MG tablet Take 1 tablet (75 mg total) by mouth daily.   fluticasone (FLONASE) 50  MCG/ACT nasal spray Place 2 sprays into both nostrils daily as needed for allergies or rhinitis.   hydrOXYzine (ATARAX/VISTARIL) 10 MG tablet Take 5 mg by mouth daily as needed for anxiety.   ketotifen (ZADITOR) 0.025 % ophthalmic solution Place 2 drops into both eyes 2 (two) times daily as needed (alleriges).   MAGNESIUM PO Take 1 tablet by mouth daily.   montelukast (SINGULAIR) 10 MG tablet Take 10 mg by mouth at bedtime.   Multiple Vitamin (MULTIVITAMIN WITH MINERALS) TABS tablet Take 3 tablets by mouth daily.   Multiple Vitamins-Minerals (ZINC PO) Take 1 tablet by mouth daily.    traZODone (DESYREL) 50 MG tablet Take 25 mg by mouth at bedtime.     Allergies:   Primaquine   Social History   Socioeconomic History   Marital status: Married    Spouse name: Not on file   Number of children: Not on file   Years of education: Not on file   Highest education level: Not on file  Occupational History   Not on file  Tobacco Use   Smoking status: Never    Passive exposure: Never   Smokeless tobacco: Never  Vaping Use   Vaping Use: Never used  Substance and Sexual Activity   Alcohol use: Yes   Drug use: Never   Sexual activity: Not on file  Other Topics Concern   Not on file  Social History Narrative   Left handed   Social Determinants of Health   Financial Resource Strain: Not on file  Food Insecurity: Not on file  Transportation Needs: Not on file  Physical Activity: Not on file  Stress: Not on file  Social Connections: Not on file    Family History: The patient's family history includes Dementia in his maternal grandfather and maternal grandmother.  ROS:   Please see the history of present illness.    All other systems reviewed and are negative.  EKGs/Labs/Other Studies Reviewed:    The following studies were reviewed today:  MRI 09/05/21 MRI HEAD IMPRESSION: 1. 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 2. Otherwise normal brain MRI. 3. Moderate paranasal sinus disease with associated right mastoid effusion.   MRA HEAD IMPRESSION:   Normal intracranial MRA. No large vessel occlusion, hemodynamically significant stenosis, or other acute vascular abnormality.   MRA NECK IMPRESSION:   1. Normal MRA of the neck with wide patency of both carotid artery systems. 2. 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 Dominant left vertebral artery widely patent.  _________________________    09/05/21 CT angio neck/head IMPRESSION: 1. Normal CTA Head and Neck,  with normal anatomic variation of the anterior and posterior circulation.   2. Small right frontal lobe posterior cortical infarct is not apparent by CT. No new intracranial abnormality.   _________________________   Echo with bubble 09/05/21 IMPRESSIONS  1. Left ventricular ejection fraction, by estimation, is 60 to 65%. The  left ventricle has normal function. The left ventricle has no regional  wall motion abnormalities. Left ventricular diastolic parameters were  normal.   2. Right ventricular systolic function is normal. The right ventricular  size is normal. There is normal pulmonary artery systolic pressure.   3. Left atrial size was mildly dilated.   4. The mitral valve is abnormal. Trivial mitral valve regurgitation. No  evidence of mitral stenosis.   5. The aortic valve is normal in structure. Aortic valve regurgitation is  not visualized. No aortic stenosis  is present.   6. The inferior vena cava is normal in size with greater than 50%  respiratory variability, suggesting right atrial pressure of 3 mmHg.   7. Agitated saline contrast bubble study was negative, with no evidence  of any interatrial shunt.    ___________________________   TEE 09/06/21 IMPRESSIONS  1. PFO present.   2. Left ventricular ejection fraction, by estimation, is 60 to 65%. The  left ventricle has normal function. The left ventricle has no regional  wall motion abnormalities.   3. Right ventricular systolic function is normal. The right ventricular  size is normal.   4. No left atrial/left atrial appendage thrombus was detected.   5. The mitral valve is normal in structure. No evidence of mitral valve  regurgitation. No evidence of mitral stenosis.   6. The aortic valve is normal in structure. Aortic valve regurgitation is  not visualized. No aortic stenosis is present.   7. The inferior vena cava is normal in size with greater than 50%  respiratory variability, suggesting right atrial pressure  of 3 mmHg.   8. Evidence of atrial level shunting detected by color flow Doppler.  There is a moderately sized patent foramen ovale with predominantly right  to left shunting across the atrial septum.   EKG:  EKG is  ordered today.  The ekg ordered today demonstrates NSR   Recent Labs: 09/07/2021: ALT 45; B Natriuretic Peptide 47.1; Magnesium 2.0 10/11/2021: BUN 13; Creatinine, Ser 1.01; Hemoglobin 15.4; Platelets 313; Potassium 4.5; Sodium 140  Recent Lipid Panel    Component Value Date/Time   CHOL 230 (H) 09/05/2021 0635   TRIG 115 09/05/2021 0635   HDL 49 09/05/2021 0635   CHOLHDL 4.7 09/05/2021 0635   VLDL 23 09/05/2021 0635   LDLCALC 158 (H) 09/05/2021 0635   Physical Exam:    VS:  BP 134/88    Pulse 79    Ht 5\' 9"  (1.753 m)    Wt 256 lb 12.8 oz (116.5 kg)    SpO2 95%    BMI 37.92 kg/m     Wt Readings from Last 3 Encounters:  10/11/21 256 lb 12.8 oz (116.5 kg)  09/21/21 256 lb 6.4 oz (116.3 kg)  09/06/21 250 lb (113.4 kg)    General: Well developed, well nourished, NAD Lungs:Clear to ausculation bilaterally. No wheezes, rales, or rhonchi. Breathing is unlabored. Cardiovascular: RRR with S1 S2. No murmurs Extremities: No edema. Neuro: Alert and oriented. No focal deficits. No facial asymmetry. MAE spontaneously. Psych: Responds to questions appropriately with normal affect.    ASSESSMENT/PLAN:    PFO: Plan to proceed with PFO closure, scheduled for 10/14/21.  The patient's Rope Score is 9, indicating an 88% chance (high probability) that the patient's stroke is 'PFO-related' and a corresponding 2% risk of recurrent TIA/CVA. The patient understands the potential benefit of PFO closure with respect to secondary stroke reduction compared with medical therapy alone. Specific risks of transcatheter PFO closure are reviewed with the patient. These risks include bleeding, infection, device embolization, stroke, cardiac perforation, tamponade, arrhythmia, MI, and late device erosion.  He understands these serious risks occur at low incidence of < 1%. We will obtain CBC and BMET today for pre-procedure testing. He was educated to take ASA and Plavix on day of procedure.    Medication Adjustments/Labs and Tests Ordered: Current medicines are reviewed at length with the patient today.  Concerns regarding medicines are outlined above.  Orders Placed This Encounter  Procedures   Basic metabolic  panel   CBC   EKG 12-Lead   No orders of the defined types were placed in this encounter.   Patient Instructions  Medication Instructions:  Your physician recommends that you continue on your current medications as directed. Please refer to the Current Medication list given to you today.  *If you need a refill on your cardiac medications before your next appointment, please call your pharmacy*   Lab Work: TODAY: BMET, CBC If you have labs (blood work) drawn today and your tests are completely normal, you will receive your results only by: MyChart Message (if you have MyChart) OR A paper copy in the mail If you have any lab test that is abnormal or we need to change your treatment, we will call you to review the results.   Testing/Procedures: REFER TO PFO CLOSURE INSTRUCTION LETTER   Follow-Up: At [facility], you and your health needs are our priority.  As part of our continuing mission to provide you with exceptional heart care, we have created designated Provider Care Teams.  These Care Teams include your primary Cardiologist (physician) and Advanced Practice Providers (APPs -  Physician Assistants and Nurse Practitioners) who all work together to provide you with the care you need, when you need it.  We recommend signing up for the patient portal called "MyChart".  Sign up information is provided on this After Visit Summary.  MyChart is used to connect with patients for Virtual Visits (Telemedicine).  Patients are able to view lab/test results, encounter notes, upcoming  appointments, etc.  Non-urgent messages can be sent to your provider as well.   To learn more about what you can do with MyChart, go to [email or web address].    Your next appointment:   KEEP SCHEDULED FOLLOW-UP   Signed, Georgie Chard, NP  10/12/2021 9:08 AM    [facility]

## 2021-10-08 NOTE — Progress Notes (Signed)
°[facility]ENTER   °MULTIDISCIPLINARY HEART VALVE CLINIC °                                    °Cardiology Office Note:   ° °Date:  10/12/2021  ° °ID:  Bobby Haynes, DOB 06/14/1991, MRN [id number] ° °PCP:  [facility]  °[facility] Cardiologist:  Michael Cooper, MD  °[facility] Electrophysiologist:  None  ° °Referring MD: [facility]  ° °Chief Complaint  °Patient presents with  ° Follow-up  °  Pre PFO closure  ° °History of Present Illness:   ° °Bobby Haynes is a 30 y.o. male with a hx of obesity and migraines who was referred to Dr. Cooper for PFO closure.  °  °Bobby Haynes presented to the [facility] on 09/05/2021 for evaluation of transient right-sided numbness, aphagia and blurred vision while having a bowel movement. Head CT showed no acute intracranial abnormality. 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 MRA showed wide patency of both carotid arteries and R vertebral artery diffusely hypoplastic and terminates in PICA, dominant L vertebral artery. CTA showed no abnormal anterior or posterior circulation. Echocardiogram showed an EF 60 to 65% with a negative bubble study. Bilateral lower extremity Dopplers were negative for DVT. Hypercoagulable work-up has been negative. TEE on 09/06/2021 showed EF 60 to 65% with evidence of atrial level shunting by color-flow Doppler as well as a moderately sized PFO with predominantly right to left shunting across the atrial septum. °  °He was seen by Dr. Cooper with plans to proceed with PFO closure, now scheduled for 10/14/21. ° °He is here alone today and reports no new neuro changes. All questions answered in regards to his upcoming procedure. He denies chest pain, palpitations, LE edema, dizziness, no new visual changes, and no syncope. ° °Past Medical History:  °Diagnosis Date  ° Depression   ° GERD (gastroesophageal reflux disease)   ° Insomnia    ° ° °Past Surgical History:  °Procedure Laterality Date  ° BUBBLE STUDY  09/06/2021  ° Procedure: BUBBLE STUDY;  Surgeon: Skains, Mark C, MD;  Location: [facility];  Service: Cardiovascular;;  ° TEE WITHOUT CARDIOVERSION N/A 09/06/2021  ° Procedure: TRANSESOPHAGEAL ECHOCARDIOGRAM (TEE);  Surgeon: Skains, Mark C, MD;  Location: [facility];  Service: Cardiovascular;  Laterality: N/A;  ° ° °Current Medications: °Current Meds  °Medication Sig  ° acetaminophen (TYLENOL) 500 MG tablet Take 1,000 mg by mouth every 6 (six) hours as needed for moderate pain.  ° albuterol (VENTOLIN HFA) 108 (90 Base) MCG/ACT inhaler Inhale 2 puffs into the lungs every 6 (six) hours as needed for shortness of breath or wheezing.  ° amoxicillin-clavulanate (AUGMENTIN) 875-125 MG tablet Take 1 tablet by mouth 2 (two) times daily.  ° aspirin EC 81 MG EC tablet Take 1 tablet (81 mg total) by mouth daily. Swallow whole.  ° atorvastatin (LIPITOR) 40 MG tablet Take 1 tablet (40 mg total) by mouth daily.  ° busPIRone (BUSPAR) 10 MG tablet Take 5 mg by mouth 2 (two) times daily.  ° cetirizine (ZYRTEC) 10 MG tablet Take 10 mg by mouth at bedtime.  ° ciprofloxacin-dexamethasone (CIPRODEX) OTIC suspension Place 4 drops into the right ear 2 (two) times daily.  ° clopidogrel (PLAVIX) 75 MG tablet Take 1 tablet (75 mg total) by mouth daily.  ° fluticasone (FLONASE) 50   MCG/ACT nasal spray Place 2 sprays into both nostrils daily as needed for allergies or rhinitis.  ° hydrOXYzine (ATARAX/VISTARIL) 10 MG tablet Take 5 mg by mouth daily as needed for anxiety.  ° ketotifen (ZADITOR) 0.025 % ophthalmic solution Place 2 drops into both eyes 2 (two) times daily as needed (alleriges).  ° MAGNESIUM PO Take 1 tablet by mouth daily.  ° montelukast (SINGULAIR) 10 MG tablet Take 10 mg by mouth at bedtime.  ° Multiple Vitamin (MULTIVITAMIN WITH MINERALS) TABS tablet Take 3 tablets by mouth daily.  ° Multiple Vitamins-Minerals (ZINC PO) Take 1 tablet by mouth daily.  °  traZODone (DESYREL) 50 MG tablet Take 25 mg by mouth at bedtime.  °  ° °Allergies:   Primaquine  ° °Social History  ° °Socioeconomic History  ° Marital status: Married  °  Spouse name: Not on file  ° Number of children: Not on file  ° Years of education: Not on file  ° Highest education level: Not on file  °Occupational History  ° Not on file  °Tobacco Use  ° Smoking status: Never  °  Passive exposure: Never  ° Smokeless tobacco: Never  °Vaping Use  ° Vaping Use: Never used  °Substance and Sexual Activity  ° Alcohol use: Yes  ° Drug use: Never  ° Sexual activity: Not on file  °Other Topics Concern  ° Not on file  °Social History Narrative  ° Left handed  ° °Social Determinants of Health  ° °Financial Resource Strain: Not on file  °Food Insecurity: Not on file  °Transportation Needs: Not on file  °Physical Activity: Not on file  °Stress: Not on file  °Social Connections: Not on file  °  °Family History: °The patient's family history includes Dementia in his maternal grandfather and maternal grandmother. ° °ROS:   °Please see the history of present illness.    °All other systems reviewed and are negative. ° °EKGs/Labs/Other Studies Reviewed:   ° °The following studies were reviewed today: ° °MRI 09/05/21 °MRI HEAD IMPRESSION: °1. 6 mm acute ischemic nonhemorrhagic cortical infarct involving the °right parietal lobe, postcentral gyrus. °2. Otherwise normal brain MRI. °3. Moderate paranasal sinus disease with associated right mastoid °effusion. °  °MRA HEAD IMPRESSION: °  °Normal intracranial MRA. No large vessel occlusion, hemodynamically °significant stenosis, or other acute vascular abnormality. °  °MRA NECK IMPRESSION: °  °1. Normal MRA of the neck with wide patency of both carotid artery °systems. °2. Right vertebral artery diffusely hypoplastic and terminates in °PICA. Dominant left vertebral artery widely patent. ° _________________________ °   °09/05/21 CT angio neck/head °IMPRESSION: °1. Normal CTA Head and Neck,  with normal anatomic variation of the °anterior and posterior circulation. °  °2. Small right frontal lobe posterior cortical infarct is not °apparent by CT. No new intracranial abnormality. °  °_________________________ °  °Echo with bubble 09/05/21 °IMPRESSIONS ° 1. Left ventricular ejection fraction, by estimation, is 60 to 65%. The  °left ventricle has normal function. The left ventricle has no regional  °wall motion abnormalities. Left ventricular diastolic parameters were  °normal.  ° 2. Right ventricular systolic function is normal. The right ventricular  °size is normal. There is normal pulmonary artery systolic pressure.  ° 3. Left atrial size was mildly dilated.  ° 4. The mitral valve is abnormal. Trivial mitral valve regurgitation. No  °evidence of mitral stenosis.  ° 5. The aortic valve is normal in structure. Aortic valve regurgitation is  °not visualized. No aortic stenosis   is present.   6. The inferior vena cava is normal in size with greater than 50%  respiratory variability, suggesting right atrial pressure of 3 mmHg.   7. Agitated saline contrast bubble study was negative, with no evidence  of any interatrial shunt.    ___________________________   TEE 09/06/21 IMPRESSIONS  1. PFO present.   2. Left ventricular ejection fraction, by estimation, is 60 to 65%. The  left ventricle has normal function. The left ventricle has no regional  wall motion abnormalities.   3. Right ventricular systolic function is normal. The right ventricular  size is normal.   4. No left atrial/left atrial appendage thrombus was detected.   5. The mitral valve is normal in structure. No evidence of mitral valve  regurgitation. No evidence of mitral stenosis.   6. The aortic valve is normal in structure. Aortic valve regurgitation is  not visualized. No aortic stenosis is present.   7. The inferior vena cava is normal in size with greater than 50%  respiratory variability, suggesting right atrial pressure  of 3 mmHg.   8. Evidence of atrial level shunting detected by color flow Doppler.  There is a moderately sized patent foramen ovale with predominantly right  to left shunting across the atrial septum.   EKG:  EKG is  ordered today.  The ekg ordered today demonstrates NSR   Recent Labs: 09/07/2021: ALT 45; B Natriuretic Peptide 47.1; Magnesium 2.0 10/11/2021: BUN 13; Creatinine, Ser 1.01; Hemoglobin 15.4; Platelets 313; Potassium 4.5; Sodium 140  Recent Lipid Panel    Component Value Date/Time   CHOL 230 (H) 09/05/2021 0635   TRIG 115 09/05/2021 0635   HDL 49 09/05/2021 0635   CHOLHDL 4.7 09/05/2021 0635   VLDL 23 09/05/2021 0635   LDLCALC 158 (H) 09/05/2021 0635   Physical Exam:    VS:  BP 134/88    Pulse 79    Ht 5\' 9"  (1.753 m)    Wt 256 lb 12.8 oz (116.5 kg)    SpO2 95%    BMI 37.92 kg/m     Wt Readings from Last 3 Encounters:  10/11/21 256 lb 12.8 oz (116.5 kg)  09/21/21 256 lb 6.4 oz (116.3 kg)  09/06/21 250 lb (113.4 kg)    General: Well developed, well nourished, NAD Lungs:Clear to ausculation bilaterally. No wheezes, rales, or rhonchi. Breathing is unlabored. Cardiovascular: RRR with S1 S2. No murmurs Extremities: No edema. Neuro: Alert and oriented. No focal deficits. No facial asymmetry. MAE spontaneously. Psych: Responds to questions appropriately with normal affect.    ASSESSMENT/PLAN:    PFO: Plan to proceed with PFO closure, scheduled for 10/14/21.  The patient's Rope Score is 9, indicating an 88% chance (high probability) that the patient's stroke is 'PFO-related' and a corresponding 2% risk of recurrent TIA/CVA. The patient understands the potential benefit of PFO closure with respect to secondary stroke reduction compared with medical therapy alone. Specific risks of transcatheter PFO closure are reviewed with the patient. These risks include bleeding, infection, device embolization, stroke, cardiac perforation, tamponade, arrhythmia, MI, and late device erosion.  He understands these serious risks occur at low incidence of < 1%. We will obtain CBC and BMET today for pre-procedure testing. He was educated to take ASA and Plavix on day of procedure.    Medication Adjustments/Labs and Tests Ordered: Current medicines are reviewed at length with the patient today.  Concerns regarding medicines are outlined above.  Orders Placed This Encounter  Procedures   Basic metabolic  panel   CBC   EKG 12-Lead   No orders of the defined types were placed in this encounter.   Patient Instructions  Medication Instructions:  Your physician recommends that you continue on your current medications as directed. Please refer to the Current Medication list given to you today.  *If you need a refill on your cardiac medications before your next appointment, please call your pharmacy*   Lab Work: TODAY: BMET, CBC If you have labs (blood work) drawn today and your tests are completely normal, you will receive your results only by: MyChart Message (if you have MyChart) OR A paper copy in the mail If you have any lab test that is abnormal or we need to change your treatment, we will call you to review the results.   Testing/Procedures: REFER TO PFO CLOSURE INSTRUCTION LETTER   Follow-Up: At [facility], you and your health needs are our priority.  As part of our continuing mission to provide you with exceptional heart care, we have created designated Provider Care Teams.  These Care Teams include your primary Cardiologist (physician) and Advanced Practice Providers (APPs -  Physician Assistants and Nurse Practitioners) who all work together to provide you with the care you need, when you need it.  We recommend signing up for the patient portal called "MyChart".  Sign up information is provided on this After Visit Summary.  MyChart is used to connect with patients for Virtual Visits (Telemedicine).  Patients are able to view lab/test results, encounter notes, upcoming  appointments, etc.  Non-urgent messages can be sent to your provider as well.   To learn more about what you can do with MyChart, go to [email or web address].    Your next appointment:   KEEP SCHEDULED FOLLOW-UP   Signed, Georgie Chard, NP  10/12/2021 9:08 AM    [facility]

## 2021-10-11 ENCOUNTER — Ambulatory Visit (INDEPENDENT_AMBULATORY_CARE_PROVIDER_SITE_OTHER): Payer: No Typology Code available for payment source | Admitting: Cardiology

## 2021-10-11 ENCOUNTER — Other Ambulatory Visit: Payer: Self-pay

## 2021-10-11 VITALS — BP 134/88 | HR 79 | Ht 69.0 in | Wt 256.8 lb

## 2021-10-11 DIAGNOSIS — I639 Cerebral infarction, unspecified: Secondary | ICD-10-CM | POA: Diagnosis not present

## 2021-10-11 DIAGNOSIS — I10 Essential (primary) hypertension: Secondary | ICD-10-CM

## 2021-10-11 DIAGNOSIS — Z01812 Encounter for preprocedural laboratory examination: Secondary | ICD-10-CM | POA: Diagnosis not present

## 2021-10-11 DIAGNOSIS — Q2112 Patent foramen ovale: Secondary | ICD-10-CM | POA: Diagnosis not present

## 2021-10-11 LAB — CBC
Hematocrit: 44.1 % (ref 37.5–51.0)
Hemoglobin: 15.4 g/dL (ref 13.0–17.7)
MCH: 30.6 pg (ref 26.6–33.0)
MCHC: 34.9 g/dL (ref 31.5–35.7)
MCV: 88 fL (ref 79–97)
Platelets: 313 10*3/uL (ref 150–450)
RBC: 5.04 x10E6/uL (ref 4.14–5.80)
RDW: 11.9 % (ref 11.6–15.4)
WBC: 7.6 10*3/uL (ref 3.4–10.8)

## 2021-10-11 LAB — BASIC METABOLIC PANEL
BUN/Creatinine Ratio: 13 (ref 9–20)
BUN: 13 mg/dL (ref 6–20)
CO2: 25 mmol/L (ref 20–29)
Calcium: 9.9 mg/dL (ref 8.7–10.2)
Chloride: 102 mmol/L (ref 96–106)
Creatinine, Ser: 1.01 mg/dL (ref 0.76–1.27)
Glucose: 90 mg/dL (ref 70–99)
Potassium: 4.5 mmol/L (ref 3.5–5.2)
Sodium: 140 mmol/L (ref 134–144)
eGFR: 102 mL/min/{1.73_m2} (ref >59)

## 2021-10-11 NOTE — Telephone Encounter (Signed)
Called pt to see what paperwork was to be sent over, pt states "it was all taken care of already, but I appreciate the call."

## 2021-10-11 NOTE — Patient Instructions (Signed)
Medication Instructions:  Your physician recommends that you continue on your current medications as directed. Please refer to the Current Medication list given to you today.  *If you need a refill on your cardiac medications before your next appointment, please call your pharmacy*   Lab Work: TODAY: BMET, CBC If you have labs (blood work) drawn today and your tests are completely normal, you will receive your results only by: MyChart Message (if you have MyChart) OR A paper copy in the mail If you have any lab test that is abnormal or we need to change your treatment, we will call you to review the results.   Testing/Procedures: REFER TO PFO CLOSURE INSTRUCTION LETTER   Follow-Up: At [facility], you and your health needs are our priority.  As part of our continuing mission to provide you with exceptional heart care, we have created designated Provider Care Teams.  These Care Teams include your primary Cardiologist (physician) and Advanced Practice Providers (APPs -  Physician Assistants and Nurse Practitioners) who all work together to provide you with the care you need, when you need it.  We recommend signing up for the patient portal called "MyChart".  Sign up information is provided on this After Visit Summary.  MyChart is used to connect with patients for Virtual Visits (Telemedicine).  Patients are able to view lab/test results, encounter notes, upcoming appointments, etc.  Non-urgent messages can be sent to your provider as well.   To learn more about what you can do with MyChart, go to [email or web address].    Your next appointment:   KEEP SCHEDULED FOLLOW-UP

## 2021-10-12 ENCOUNTER — Telehealth: Payer: Self-pay | Admitting: *Deleted

## 2021-10-12 NOTE — Telephone Encounter (Signed)
PFO Closure scheduled at [facility] for: Thursday October 14, 2021 10 AM-pt knows procedure time change Arrive [facility] Main Entrance A [facility]) at: 7:30 AM   Diet-no solid food after midnight prior to cath, clear liquids until 5 AM day of procedure.  Medication instructions for procedure: -Usual morning medications can be taken pre-cath with sips of water including aspirin 81 mg and Plavix 75 mg.    Must have responsible adult to drive home post procedure and be with patient first 24 hours after arriving home.  [facility] does allow one visitor to wait in the waiting room during the time you are there.   Patient reports does not currently have any new symptoms concerning for COVID-19 and no household members with COVID-19 like illness.    Reviewed procedure instructions with patient.

## 2021-10-14 ENCOUNTER — Ambulatory Visit (HOSPITAL_COMMUNITY)
Admission: RE | Admit: 2021-10-14 | Discharge: 2021-10-14 | Disposition: A | Discharge status: Home / Self Care | Payer: No Typology Code available for payment source | Attending: Cardiovascular Disease | Admitting: Cardiovascular Disease

## 2021-10-14 ENCOUNTER — Ambulatory Visit (HOSPITAL_BASED_OUTPATIENT_CLINIC_OR_DEPARTMENT_OTHER): Payer: No Typology Code available for payment source

## 2021-10-14 ENCOUNTER — Other Ambulatory Visit: Payer: Self-pay

## 2021-10-14 ENCOUNTER — Telehealth: Payer: Self-pay | Admitting: Physician Assistant

## 2021-10-14 ENCOUNTER — Encounter (HOSPITAL_COMMUNITY)
Admission: RE | Disposition: A | Discharge status: Home / Self Care | Payer: No Typology Code available for payment source | Source: Home / Self Care | Attending: Cardiovascular Disease

## 2021-10-14 DIAGNOSIS — Q211 Atrial septal defect, unspecified: Secondary | ICD-10-CM | POA: Diagnosis not present

## 2021-10-14 DIAGNOSIS — E669 Obesity, unspecified: Secondary | ICD-10-CM | POA: Insufficient documentation

## 2021-10-14 DIAGNOSIS — Z8673 Personal history of transient ischemic attack (TIA), and cerebral infarction without residual deficits: Secondary | ICD-10-CM | POA: Insufficient documentation

## 2021-10-14 DIAGNOSIS — Z6837 Body mass index (BMI) 37.0-37.9, adult: Secondary | ICD-10-CM | POA: Insufficient documentation

## 2021-10-14 DIAGNOSIS — Z7982 Long term (current) use of aspirin: Secondary | ICD-10-CM | POA: Diagnosis not present

## 2021-10-14 DIAGNOSIS — Z79899 Other long term (current) drug therapy: Secondary | ICD-10-CM | POA: Insufficient documentation

## 2021-10-14 DIAGNOSIS — Z7902 Long term (current) use of antithrombotics/antiplatelets: Secondary | ICD-10-CM | POA: Insufficient documentation

## 2021-10-14 DIAGNOSIS — Q2112 Patent foramen ovale: Secondary | ICD-10-CM | POA: Diagnosis not present

## 2021-10-14 HISTORY — PX: PATENT FORAMEN OVALE(PFO) CLOSURE: CATH118300

## 2021-10-14 LAB — POCT ACTIVATED CLOTTING TIME: Activated Clotting Time: 396 seconds

## 2021-10-14 LAB — ECHOCARDIOGRAM LIMITED
Height: 69 in
Weight: 4032 oz

## 2021-10-14 SURGERY — PATENT FORAMEN OVALE (PFO) CLOSURE
Anesthesia: LOCAL

## 2021-10-14 MED ORDER — ONDANSETRON HCL 4 MG/2ML IJ SOLN
INTRAMUSCULAR | Status: DC | PRN
  Administered 2021-10-14: 4 mg via INTRAVENOUS

## 2021-10-14 MED ORDER — HEPARIN SODIUM (PORCINE) 1000 UNIT/ML IJ SOLN
INTRAMUSCULAR | Status: AC
  Filled 2021-10-14: qty 10

## 2021-10-14 MED ORDER — SODIUM CHLORIDE 0.9 % IV SOLN: 250.0000 mL | INTRAVENOUS | Status: DC | PRN

## 2021-10-14 MED ORDER — FENTANYL CITRATE (PF) 100 MCG/2ML IJ SOLN
INTRAMUSCULAR | Status: AC
  Filled 2021-10-14: qty 2

## 2021-10-14 MED ORDER — HEPARIN (PORCINE) IN NACL 1000-0.9 UT/500ML-% IV SOLN
INTRAVENOUS | Status: AC
  Filled 2021-10-14: qty 500

## 2021-10-14 MED ORDER — MORPHINE SULFATE (PF) 2 MG/ML IV SOLN
2.0000 mg | Freq: Once | INTRAVENOUS | Status: AC
  Administered 2021-10-14: 2 mg via INTRAVENOUS

## 2021-10-14 MED ORDER — MIDAZOLAM HCL 2 MG/2ML IJ SOLN
INTRAMUSCULAR | Status: AC
  Filled 2021-10-14: qty 2

## 2021-10-14 MED ORDER — HEPARIN (PORCINE) IN NACL 1000-0.9 UT/500ML-% IV SOLN
INTRAVENOUS | Status: DC | PRN
  Administered 2021-10-14 (×3): 500 mL

## 2021-10-14 MED ORDER — ONDANSETRON HCL 4 MG/2ML IJ SOLN
INTRAMUSCULAR | Status: AC
  Filled 2021-10-14: qty 2

## 2021-10-14 MED ORDER — HYDRALAZINE HCL 20 MG/ML IJ SOLN: 10.0000 mg | INTRAMUSCULAR | Status: DC | PRN

## 2021-10-14 MED ORDER — LIDOCAINE HCL (PF) 1 % IJ SOLN
INTRAMUSCULAR | Status: DC | PRN
  Administered 2021-10-14: 10 mL

## 2021-10-14 MED ORDER — ASPIRIN 81 MG PO CHEW: 81.0000 mg | CHEWABLE_TABLET | ORAL | Status: DC

## 2021-10-14 MED ORDER — CEFAZOLIN SODIUM-DEXTROSE 2-4 GM/100ML-% IV SOLN
2.0000 g | INTRAVENOUS | Status: AC
  Administered 2021-10-14: 2 g via INTRAVENOUS
  Filled 2021-10-14: qty 100

## 2021-10-14 MED ORDER — ACETAMINOPHEN 325 MG PO TABS
650.0000 mg | ORAL_TABLET | ORAL | Status: DC | PRN
  Administered 2021-10-14: 650 mg via ORAL
  Filled 2021-10-14: qty 2

## 2021-10-14 MED ORDER — MIDAZOLAM HCL 2 MG/2ML IJ SOLN
INTRAMUSCULAR | Status: DC | PRN
  Administered 2021-10-14: 1 mg via INTRAVENOUS
  Administered 2021-10-14: 2 mg via INTRAVENOUS
  Administered 2021-10-14: 1 mg via INTRAVENOUS

## 2021-10-14 MED ORDER — HEPARIN SODIUM (PORCINE) 1000 UNIT/ML IJ SOLN
INTRAMUSCULAR | Status: DC | PRN
  Administered 2021-10-14: 15000 [IU] via INTRAVENOUS

## 2021-10-14 MED ORDER — SODIUM CHLORIDE 0.9 % WEIGHT BASED INFUSION
3.0000 mL/kg/h | INTRAVENOUS | Status: AC
  Administered 2021-10-14: 3 mL/kg/h via INTRAVENOUS

## 2021-10-14 MED ORDER — ONDANSETRON HCL 4 MG/2ML IJ SOLN: 4.0000 mg | Freq: Four times a day (QID) | INTRAMUSCULAR | Status: DC | PRN

## 2021-10-14 MED ORDER — SODIUM CHLORIDE 0.9% FLUSH: 3.0000 mL | INTRAVENOUS | Status: DC | PRN

## 2021-10-14 MED ORDER — SODIUM CHLORIDE 0.9% FLUSH: 3.0000 mL | Freq: Two times a day (BID) | INTRAVENOUS | Status: DC

## 2021-10-14 MED ORDER — LIDOCAINE HCL (PF) 1 % IJ SOLN
INTRAMUSCULAR | Status: AC
  Filled 2021-10-14: qty 30

## 2021-10-14 MED ORDER — CLOPIDOGREL BISULFATE 75 MG PO TABS: 75.0000 mg | ORAL_TABLET | ORAL | Status: DC

## 2021-10-14 MED ORDER — FENTANYL CITRATE (PF) 100 MCG/2ML IJ SOLN
INTRAMUSCULAR | Status: DC | PRN
Start: 2021-10-14 — End: 2021-10-14
  Administered 2021-10-14 (×2): 50 ug via INTRAVENOUS
  Administered 2021-10-14: 25 ug via INTRAVENOUS

## 2021-10-14 MED ORDER — CEFAZOLIN SODIUM-DEXTROSE 2-4 GM/100ML-% IV SOLN
INTRAVENOUS | Status: AC
  Filled 2021-10-14: qty 100

## 2021-10-14 MED ORDER — LABETALOL HCL 5 MG/ML IV SOLN: 10.0000 mg | INTRAVENOUS | Status: DC | PRN

## 2021-10-14 MED ORDER — SODIUM CHLORIDE 0.9 % WEIGHT BASED INFUSION: 1.0000 mL/kg/h | INTRAVENOUS | Status: DC

## 2021-10-14 MED ORDER — MORPHINE SULFATE (PF) 2 MG/ML IV SOLN
INTRAVENOUS | Status: AC
  Filled 2021-10-14: qty 1

## 2021-10-14 SURGICAL SUPPLY — 19 items
CATH 8FR ACUNAV REPROCESSED (CATHETERS) IMPLANT
CATH INFINITI 6F MPA2 100CM (CATHETERS) ×1 IMPLANT
CATH REPROCESSED 8FR ACUNAV (CATHETERS) ×2 IMPLANT
CLOSURE PERCLOSE PROSTYLE (VASCULAR PRODUCTS) ×2 IMPLANT
COVER SWIFTLINK CONNECTOR (BAG) ×1 IMPLANT
GUIDEWIRE AMPLATZER 1.5JX260 (WIRE) ×1 IMPLANT
KIT HEART LEFT (KITS) ×2 IMPLANT
KIT MICROPUNCTURE NIT STIFF (SHEATH) ×1 IMPLANT
OCCLUDER PFO TALISMAN 25-18 (Prosthesis & Implant Heart) IMPLANT
PACK CARDIAC CATHETERIZATION (CUSTOM PROCEDURE TRAY) ×2 IMPLANT
SHEATH DELIVERY TALISMAN 8F 80 (SHEATH) IMPLANT
SHEATH INTROD W/O MIN 9FR 25CM (SHEATH) ×1 IMPLANT
SHEATH PINNACLE 8F 10CM (SHEATH) ×1 IMPLANT
SHEATH PROBE COVER 6X72 (BAG) ×1 IMPLANT
TALISMAN DELIVERY SHEATH 8F 80 (SHEATH) ×2
TALISMAN PFO OCCLUDER 25-18 (Prosthesis & Implant Heart) ×2 IMPLANT
TRANSDUCER W/STOPCOCK (MISCELLANEOUS) ×2 IMPLANT
TUBING CIL FLEX 10 FLL-RA (TUBING) ×2 IMPLANT
WIRE EMERALD 3MM-J .035X150CM (WIRE) ×1 IMPLANT

## 2021-10-14 NOTE — Interval H&P Note (Signed)
History and Physical Interval Note: ? ?10/14/2021 ?9:06 AM ? ?Bobby Haynes  has presented today for surgery, with the diagnosis of pfo.  The various methods of treatment have been discussed with the patient and family. After consideration of risks, benefits and other options for treatment, the patient has consented to  Procedure(s): ?PATENT FORAMEN OVALE (PFO) CLOSURE (N/A) as a surgical intervention.  The patient's history has been reviewed, patient examined, no change in status, stable for surgery.  I have reviewed the patient's chart and labs.  Questions were answered to the patient's satisfaction.   ? ? ?Tonny Bollman ? ? ?

## 2021-10-14 NOTE — Telephone Encounter (Signed)
? ?  Pt had a PFO closure today. ? ?After he got home, he was trying to have a BM, and felt a stinging pain in his groin. He got a drop of blood on his dressing and they thought he might have damaged his site. ? ?His wife was able to palpate the site, no sig hematoma, small firm area at the site has resolved. He does have a drop of blood on his dressing, new from first arriving at home, but not more. ? ?Requested his wife hold pressure for 10" at the site and that he hold firm pressure on the site while having BM for the next few days.  ? ?They are in agreement with this plan of care. ? ?I reassured them that they could come back at any time. ? ?If he develops an increasing knot under the skin (hematoma), or steady blood is coming from his site, they need to call 911. ? ?Rosaria Ferries, PA-C ?10/14/2021 ?7:24 PM ? ? ? ?

## 2021-10-14 NOTE — Progress Notes (Signed)
?  [facility]   ?MULTIDISCIPLINARY HEART VALVE TEAM  ? ?Patient seen after hematoma noted in the post procedure setting. Per chart review, manual pressure held by Dr. Excell Seltzer and Dr. Lynnette Caffey. Pressure dressing now in place with stability. There is no overt evidence of right groin hematoma at this time. He has mild tenderness to touch. Reviewed post procedure groin care. Would recommend no prolonged ambulation for the next several days with lifting restrictions. Reiterated that he may call our after hours answering service if needed with any questions or concerns, otherwise we are available to assist.  ? ? ?Georgie Chard NP-C ?Structural Heart Team  ?Pager: [phone] ?Phone: [phone] ?Office: [phone] ?

## 2021-10-14 NOTE — Progress Notes (Addendum)
Pt brought to cath lab holding, Bay 4, connected to monitor, Bobby Haynes, RCIS at bedside holding pressure to right groin, Dr. Burt Knack in to bedside shortly after and took over holding manual pressure to right groin, informed ok to move pt to Short Stay, Dr Ali Lowe to bedside and took over holding manual pressure to right groin, right groin soft, pressure dressing in place, no drainage noted at this time, bilateral pedal pulses at +2, left slightly stronger than right, although manual pressure remains in place to right groin, safety maintained, see MAR for med given ? ?Bedrest to start at 1125 ?

## 2021-10-14 NOTE — Progress Notes (Signed)
After bedrest, walked with patient in hallway with Dr. Lynnette Caffey present. Removed pressure dressing and MD assessed right groin. Bruising and small hematoma present, MD stated hematoma should resolve on it's on and he was ok to be discharged. Writer redressed right groin with gauze and tegaderm. Patient is being discharged per MD orders.  ?

## 2021-10-14 NOTE — Progress Notes (Signed)
Echocardiogram ?2D Echocardiogram has been performed. ? ?Warren Lacy Nelida Mandarino RDCS ?10/14/2021, 2:44 PM ?

## 2021-10-15 ENCOUNTER — Encounter (HOSPITAL_COMMUNITY): Payer: Self-pay | Admitting: Cardiovascular Disease

## 2021-10-15 ENCOUNTER — Encounter: Payer: Self-pay | Admitting: Cardiovascular Disease

## 2021-10-15 ENCOUNTER — Telehealth: Payer: Self-pay | Admitting: Cardiovascular Disease

## 2021-10-15 NOTE — Telephone Encounter (Signed)
See telephone note.

## 2021-10-15 NOTE — Telephone Encounter (Signed)
Patient had a PFO closure yesterday.  He states he can feel tightness at the incision site.  He wants to make sure that is something normal?  Or if it's something he should be concerned about? ?

## 2021-10-15 NOTE — Telephone Encounter (Signed)
Returned call to patient who states that the incision site on his groin feels tight and has a pulling sensation when he walks. Pt denies bruising, bleeding, pain or other symptoms at site. Also states that his calf on same leg feels numb and occasionally when stretching out, experiences shooting pain that subsides quickly. Contacted Georgie Chard for advisement: ? ?Per Noreene Larsson: "He may be a little swollen. He had a small hematoma yesterday after the procedure but groin site looked great before he left. He had manual pressure so it's likely just a little tissue trauma. As long as there is no increased pain, sensory changes, or overt bleeding, I think it is probably fine. Unfortunately all of our offices are closed to get him in for an ultrasound. We can get him scheduled over at [facility] to rule out a pseudo if he thinks it can wait until then." ? ?Relayed all info to patient who states he doesn't feel this is necessary, he was just concerned that he could feel a lump under the skin when he applied slight pressure. Pt will call back in if he has any bleeding from site, size of lump continues to grow, pain, signs of DVT/increased calf pain, or any new concerns.  ?

## 2021-10-16 ENCOUNTER — Emergency Department (HOSPITAL_COMMUNITY)
Admission: EM | Admit: 2021-10-16 | Discharge: 2021-10-17 | Disposition: A | Discharge status: Home / Self Care | Payer: No Typology Code available for payment source | Attending: Emergency Medicine | Admitting: Emergency Medicine

## 2021-10-16 ENCOUNTER — Encounter (HOSPITAL_COMMUNITY): Payer: Self-pay | Admitting: Emergency Medicine

## 2021-10-16 ENCOUNTER — Other Ambulatory Visit: Payer: Self-pay

## 2021-10-16 DIAGNOSIS — D649 Anemia, unspecified: Secondary | ICD-10-CM | POA: Insufficient documentation

## 2021-10-16 DIAGNOSIS — I97630 Postprocedural hematoma of a circulatory system organ or structure following a cardiac catheterization: Secondary | ICD-10-CM | POA: Diagnosis not present

## 2021-10-16 DIAGNOSIS — G8918 Other acute postprocedural pain: Secondary | ICD-10-CM | POA: Diagnosis not present

## 2021-10-16 DIAGNOSIS — Z7902 Long term (current) use of antithrombotics/antiplatelets: Secondary | ICD-10-CM | POA: Diagnosis not present

## 2021-10-16 DIAGNOSIS — M7981 Nontraumatic hematoma of soft tissue: Secondary | ICD-10-CM | POA: Diagnosis present

## 2021-10-16 DIAGNOSIS — S301XXA Contusion of abdominal wall, initial encounter: Secondary | ICD-10-CM

## 2021-10-16 NOTE — ED Provider Triage Note (Signed)
Emergency Medicine Provider Triage Evaluation Note ? ?Bobby Haynes , a 31 y.o. male  was evaluated in triage.  Pt complains of ecchymosis.  Patient had a patent foramen ovale closure on March 2 by Dr. Excell Seltzer.  States that he initially had a hematoma at the site.  Over the past 2 days has had worsening ecchymosis that now spans the whole upper thigh.  Reports pain in the region.  States that he has been experiencing numbness in the right calf as well. ? ?Physical Exam  ?BP (!) 145/98 (BP Location: Left Arm)   Pulse (!) 107   Temp 98.5 ?F (36.9 ?C) (Oral)   Resp 19   SpO2 97%  ?Gen:   Awake, no distress   ?Resp:  Normal effort  ?MSK:   Moves extremities without difficulty  ?Other:  Extensive ecchymosis in the right inguinal region and right thigh down to about the mid thigh. ? ?Medical Decision Making  ?Medically screening exam initiated at 11:48 PM.  Appropriate orders placed.  Bobby Haynes was informed that the remainder of the evaluation will be completed by another provider, this initial triage assessment does not replace that evaluation, and the importance of remaining in the ED until their evaluation is complete. ?  ?Bobby Sou, PA-C ?10/16/21 2349 ? ?

## 2021-10-16 NOTE — ED Triage Notes (Signed)
Pt has a PFO closure.  Pt reports swelling and increased bruising to right groin (by 50%).  Pt reports numbness in leg as well.   ?

## 2021-10-17 ENCOUNTER — Emergency Department (HOSPITAL_BASED_OUTPATIENT_CLINIC_OR_DEPARTMENT_OTHER): Payer: No Typology Code available for payment source

## 2021-10-17 DIAGNOSIS — I724 Aneurysm of artery of lower extremity: Secondary | ICD-10-CM

## 2021-10-17 LAB — BASIC METABOLIC PANEL
Anion gap: 8 (ref 5–15)
BUN: 11 mg/dL (ref 6–20)
CO2: 25 mmol/L (ref 22–32)
Calcium: 9 mg/dL (ref 8.9–10.3)
Chloride: 104 mmol/L (ref 98–111)
Creatinine, Ser: 1.02 mg/dL (ref 0.61–1.24)
GFR, Estimated: >60 mL/min (ref >60)
Glucose, Bld: 105 mg/dL — ABNORMAL HIGH (ref 70–99)
Potassium: 3.8 mmol/L (ref 3.5–5.1)
Sodium: 137 mmol/L (ref 135–145)

## 2021-10-17 LAB — CBC
HCT: 36.3 % — ABNORMAL LOW (ref 39.0–52.0)
Hemoglobin: 12.5 g/dL — ABNORMAL LOW (ref 13.0–17.0)
MCH: 30.5 pg (ref 26.0–34.0)
MCHC: 34.4 g/dL (ref 30.0–36.0)
MCV: 88.5 fL (ref 80.0–100.0)
Platelets: 256 10*3/uL (ref 150–400)
RBC: 4.1 MIL/uL — ABNORMAL LOW (ref 4.22–5.81)
RDW: 12 % (ref 11.5–15.5)
WBC: 8.8 10*3/uL (ref 4.0–10.5)
nRBC: 0 % (ref 0.0–0.2)

## 2021-10-17 MED ORDER — HYDROCODONE-ACETAMINOPHEN 5-325 MG PO TABS
1.0000 | ORAL_TABLET | Freq: Once | ORAL | Status: AC
  Administered 2021-10-17: 1 via ORAL
  Filled 2021-10-17: qty 1

## 2021-10-17 MED ORDER — HYDROCODONE-ACETAMINOPHEN 5-325 MG PO TABS: 1.0000 | ORAL_TABLET | Freq: Four times a day (QID) | ORAL | 0 refills | Status: DC | PRN

## 2021-10-17 MED ORDER — HYDROCODONE-ACETAMINOPHEN 5-325 MG PO TABS
1.0000 | ORAL_TABLET | Freq: Four times a day (QID) | ORAL | 0 refills | Status: DC | PRN
Start: 2021-10-17 — End: 2021-10-17

## 2021-10-17 NOTE — ED Notes (Signed)
Pt back in room.

## 2021-10-17 NOTE — ED Notes (Signed)
Patient transported to Ultrasound 

## 2021-10-17 NOTE — ED Provider Notes (Signed)
?[facility] EMERGENCY DEPARTMENT ?Provider Note ? ? ?CSN: [id number] ?Arrival date & time: 10/16/21  2334 ? ?  ? ?History ? ?Chief Complaint  ?Patient presents with  ? Post-op Problem  ? ? ?Bobby Haynes is a 31 y.o. male. ? ?The history is provided by the patient.  ?Patient presents with right groin pain and bruising.  Patient underwent catheterization on March 2 for a patent foramen ovale closure.  Since that time he has had increasing pain, bruising and a firm area near the puncture site.  He reports it hurts to walk.  He has had a mild  numbness in his leg as well ?No fevers or vomiting.  No chest pain or shortness of breath ?  ?Past Medical History:  ?Diagnosis Date  ? Depression   ? GERD (gastroesophageal reflux disease)   ? Insomnia   ? ? ?Home Medications ?Prior to Admission medications   ?Medication Sig Start Date End Date Taking? Authorizing Provider  ?acetaminophen (TYLENOL) 500 MG tablet Take 1,000 mg by mouth every 6 (six) hours as needed for moderate pain.    [provider]  ?albuterol (VENTOLIN HFA) 108 (90 Base) MCG/ACT inhaler Inhale 2 puffs into the lungs every 6 (six) hours as needed for shortness of breath or wheezing. 09/21/20   [provider]  ?amoxicillin-clavulanate (AUGMENTIN) 875-125 MG tablet Take 1 tablet by mouth 2 (two) times daily.    [provider]  ?aspirin EC 81 MG EC tablet Take 1 tablet (81 mg total) by mouth daily. Swallow whole. 09/08/21   Leroy Sea, MD  ?atorvastatin (LIPITOR) 40 MG tablet Take 1 tablet (40 mg total) by mouth daily. 09/08/21   Leroy Sea, MD  ?busPIRone (BUSPAR) 10 MG tablet Take 5 mg by mouth 2 (two) times daily. 04/14/21   [provider]  ?cetirizine (ZYRTEC) 10 MG tablet Take 10 mg by mouth at bedtime.    [provider]  ?ciprofloxacin-dexamethasone (CIPRODEX) OTIC suspension Place 4 drops into the right ear 2 (two) times daily.    [provider]  ?clopidogrel (PLAVIX)  75 MG tablet Take 1 tablet (75 mg total) by mouth daily. 09/08/21   Leroy Sea, MD  ?fluticasone Aleda Grana) 50 MCG/ACT nasal spray Place 2 sprays into both nostrils daily as needed for allergies or rhinitis.    [provider]  ?hydrOXYzine (ATARAX/VISTARIL) 10 MG tablet Take 5 mg by mouth daily as needed for anxiety. 04/14/21   [provider]  ?ketotifen (ZADITOR) 0.025 % ophthalmic solution Place 2 drops into both eyes 2 (two) times daily as needed (alleriges).    [provider]  ?MAGNESIUM PO Take 1 tablet by mouth daily.    [provider]  ?montelukast (SINGULAIR) 10 MG tablet Take 10 mg by mouth at bedtime. 03/01/21   [provider]  ?Multiple Vitamin (MULTIVITAMIN WITH MINERALS) TABS tablet Take 3 tablets by mouth daily.    [provider]  ?Multiple Vitamins-Minerals (ZINC PO) Take 1 tablet by mouth daily.    [provider]  ?traZODone (DESYREL) 50 MG tablet Take 25 mg by mouth at bedtime. 04/14/21   [provider]  ?   ? ?Allergies    ?Primaquine   ? ?Review of Systems   ?Review of Systems  ?Constitutional:  Negative for fever.  ?Respiratory:  Negative for shortness of breath.   ?Cardiovascular:  Negative for chest pain.  ?Skin:  Positive for color change.  ?Neurological:  Positive for numbness.  Negative for weakness.  ? ?Physical Exam ?Updated Vital Signs ?BP 118/62   Pulse 91   Temp 98.5 ?F (36.9 ?C) (Oral)   Resp 15   Ht 1.753 m (5\' 9" )   Wt 114.3 kg   SpO2 98%   BMI 37.21 kg/m?  ?Physical Exam ?CONSTITUTIONAL: Well developed/well nourished ?HEAD: Normocephalic/atraumatic ?EYES: EOMI ?ENMT: Mucous membranes moist ?NECK: supple no meningeal signs ?SPINE/BACK:entire spine nontender ?CV: S1/S2 noted, no murmurs/rubs/gallops noted ?LUNGS: Lungs are clear to auscultation bilaterally, no apparent distress ?ABDOMEN: soft, nontender, no rebound or guarding, bowel sounds noted throughout abdomen ?GU:no cva tenderness, no  bruising or erythema into the scrotum or perineum ?NEURO: Pt is awake/alert/appropriate, moves all extremitiesx4.  No facial droop.  Appropriate strength noted in bilateral LE.  No sensory deficit noted ?EXTREMITIES:full ROM, significant tenderness noted over the puncture site with induration.  No thrill, no crepitus.  No discharge.  Significant bruising throughout the right thigh and groin.  See photo ?Distal pulses equal/intact in feet.  No palpable R femoral pulse ?SKIN: warm, see photo ?PSYCH: no abnormalities of mood noted, alert and oriented to situation ? ? ? ?Patient gave verbal permission to utilize photo for medical documentation only ?The image was not stored on any personal device ? ?ED Results / Procedures / Treatments   ?Labs ?(all labs ordered are listed, but only abnormal results are displayed) ?Labs Reviewed  ?CBC - Abnormal; Notable for the following components:  ?    Result Value  ? RBC 4.10 (*)   ? Hemoglobin 12.5 (*)   ? HCT 36.3 (*)   ? All other components within normal limits  ?BASIC METABOLIC PANEL - Abnormal; Notable for the following components:  ? Glucose, Bld 105 (*)   ? All other components within normal limits  ? ? ?EKG ?None ? ?Radiology ?No results found. ? ?Procedures ?Procedures  ? ? ?Medications Ordered in ED ?Medications - No data to display ? ?ED Course/ Medical Decision Making/ A&P ?Clinical Course as of 10/17/21 0652  ?12/17/21 Oct 17, 2021  ?Oct 19, 2021 Discussed with cardiologist Dr. Arly.Das.  Biggest concern is for pseudoaneurysm.  Patient will need vascular ultrasound later in the morning. [DW]  ?[phone] Signed out to dr [id number] at shift change to f/u on vascular imaging [DW]  ?  ?Clinical Course User Index ?[DW] Rodena Medin, MD  ? ?                        ?Medical Decision Making ?Amount and/or Complexity of Data Reviewed ?Labs: ordered. ?ECG/medicine tests: ordered. ? ?Risk ?Prescription drug management. ? ? ?Patient presents with pain and swelling and bruising near the puncture  site of a recent catheterization for PFO closure. ?Patient has significant tenderness and induration at the puncture site.  Differential includes deep space infection, as well as pseudoaneurysm at the puncture site ? ?After discussion with cardiology via the phone, patient will need vascular ultrasounding to rule out pseudoaneurysm ?Patient will be monitored in the emergency department. ?1:56 AM ?Labs were personally reviewed mild anemia is noted but no signs of any active blood loss or expanding hematoma ?Plan will be to monitor in the ED and have asked ultrasound later in the morning ? ? ? ? ? ? ?Final Clinical Impression(s) / ED Diagnoses ?Final diagnoses:  ?Post-operative pain  ? ? ?Rx / DC Orders ?ED Discharge Orders   ? ? None  ? ?  ? ? ?  ?Zadie Rhine, MD ?10/17/21 12/17/21 ? ?

## 2021-10-17 NOTE — Discharge Instructions (Signed)
Return for any problem.  ?

## 2021-10-17 NOTE — ED Provider Notes (Signed)
Seen after prior EDP. ? ?Ultrasound imaging is without evidence of pseudoaneurysm or other significant complication. ? ?Patient is comfortable. ? ?Patient without noted expansion of hematoma during his ED evaluation. ? ?Case again discussed with Dr. Royann Shivers (Cardiology).  Patient will be scheduled for close follow-up within the next several days in the clinic.  Patient appropriate for discharge from the ED today. ? ?Patient understands need for close follow-up.  Strict return precautions given and understood. ?  ?Wynetta Fines, MD ?10/17/21 [phone] ? ?

## 2021-10-17 NOTE — Progress Notes (Signed)
VASCULAR LAB ? ? ? ?Right groin ultrasound has been performed. ? ?See CV proc for preliminary results. ? ?Message Dr. Rodena Medin results via secure chat. ? ?Fount Bahe, RVT ?10/17/2021, 7:49 AM ? ?

## 2021-10-17 NOTE — ED Notes (Signed)
Transported to [facility].

## 2021-10-17 NOTE — ED Notes (Signed)
Pt stated "he felt like the bruise on his thigh was spreading and was worried." This RN told the pt she would inform the EDP and also marked the pt's edges with a suture marker to help keep a closer eye on if the bleeding and bruising were spreading and getting worse. Will Continue to monitor.  ?

## 2021-10-18 ENCOUNTER — Other Ambulatory Visit: Payer: Self-pay

## 2021-10-18 ENCOUNTER — Telehealth: Payer: Self-pay | Admitting: Cardiovascular Disease

## 2021-10-18 ENCOUNTER — Ambulatory Visit (INDEPENDENT_AMBULATORY_CARE_PROVIDER_SITE_OTHER): Payer: No Typology Code available for payment source | Admitting: Cardiology

## 2021-10-18 VITALS — BP 120/90 | HR 113 | Ht 69.0 in | Wt 252.0 lb

## 2021-10-18 DIAGNOSIS — Q2112 Patent foramen ovale: Secondary | ICD-10-CM

## 2021-10-18 DIAGNOSIS — I639 Cerebral infarction, unspecified: Secondary | ICD-10-CM

## 2021-10-18 DIAGNOSIS — S301XXA Contusion of abdominal wall, initial encounter: Secondary | ICD-10-CM | POA: Diagnosis not present

## 2021-10-18 NOTE — Progress Notes (Signed)
[place]                                     Cardiology Office Note:    Date:  10/19/2021   ID:  Bobby Haynes, DOB [birth year], MRN [id number]  PCP:  [place] Cardiologist:  Sherren Mocha, MD   Referring MD: [facility]   Chief Complaint  Patient presents with   Follow-up   hematoma   History of Present Illness:    Bobby Haynes is a 31 y.o. male with a hx of obesity and migraines who was referred to Dr. Burt Knack for PFO closure which was performed 10/14/21.   Bobby Haynes presented to the [facility] on 09/05/2021 for evaluation of transient right-sided numbness, aphagia and blurred vision while having a bowel movement. Head CT showed no acute intracranial abnormality. 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 MRA showed wide patency of both carotid arteries and R vertebral artery diffusely hypoplastic and terminates in PICA, dominant L vertebral artery. CTA showed no abnormal anterior or posterior circulation. Echocardiogram showed an EF 60 to 65% with a negative bubble study. Bilateral lower extremity Dopplers were negative for DVT. Hypercoagulable work-up has been negative. TEE on 09/06/2021 showed EF 60 to 65% with evidence of atrial level shunting by color-flow Doppler as well as a moderately sized PFO with predominantly right to left shunting across the atrial septum.   He was seen by Dr. Burt Knack with plans to proceed with PFO closure which was performed 10/14/21 with 25 mm Amplatzer Talisman PFO occluder device Recommendations are for DAPT with ASA and plavix x 6 months.SBE prophylaxis with Amoxicillin will be prescribed today for 6 months   The patient called the office Friday 10/15/21 in the late afternoon with c/o increased groin bruising. Unfortunately this was around 5pm therefore no ability to schedule for OP [place]  at our [place] office. ED precautions were reviewed with him. Plan was to schedule closer follow up. Due to worsening groin ecchymosis, he presented to the ED at which time right groin ultrasound imaging was without evidence of pseudoaneurysm or other significant complication although there was presence of a hematoma measuring 7cm X 6cm. CBC with Hb at 12.5>>previously 15.4. All other labs were noted to be stable. EDP discussed case with Dr. Sallyanne Kuster and patient felt to be appropriate for discharge.   He is here today for close follow up with his wife. He comes walking on crutches due to pain with ambulation. On groin assessment, there is profound right groin ecchymosis medically and laterally and down his medial thigh. There is a definite ridge at the hematoma site. Area was marked during ED assessment. Does not appear that the area has grown. He does report mild numbness along the medical thigh, likely from nerve compression from hematoma. Dr. Ali Lowe in the office today who came to assess the site. My first thought was to schedule repeat US follow up in a week or two however Dr. Ali Lowe does not think this is necessary. Otherwise, the patient has no other complaints including chest pain, palpitations, LE edema, no new neuro changes, no dizziness, or syncope.   Past Medical History:  Diagnosis Date   Depression    GERD (gastroesophageal reflux disease)    Insomnia     Past Surgical  History:  Procedure Laterality Date   BUBBLE STUDY  09/06/2021   Procedure: BUBBLE STUDY;  Surgeon: Jerline Pain, MD;  [facility] ENDOSCOPY;  Service: Cardiovascular;;   PATENT FORAMEN OVALE(PFO) CLOSURE N/A 10/14/2021   Procedure: PATENT FORAMEN OVALE (PFO) CLOSURE;  Surgeon: Sherren Mocha, MD;  Location: [place] CV LAB;  Service: Cardiovascular;  Laterality: N/A;   TEE WITHOUT CARDIOVERSION N/A 09/06/2021   Procedure: TRANSESOPHAGEAL ECHOCARDIOGRAM (TEE);  Surgeon: Jerline Pain, MD;  Location: [facility] ENDOSCOPY;   Service: Cardiovascular;  Laterality: N/A;    Current Medications: Current Meds  Medication Sig   acetaminophen (TYLENOL) 500 MG tablet Take 1,000 mg by mouth every 6 (six) hours as needed for moderate pain.   albuterol (VENTOLIN HFA) 108 (90 Base) MCG/ACT inhaler Inhale 2 puffs into the lungs every 6 (six) hours as needed for shortness of breath or wheezing.   aspirin EC 81 MG EC tablet Take 1 tablet (81 mg total) by mouth daily. Swallow whole.   atorvastatin (LIPITOR) 40 MG tablet Take 1 tablet (40 mg total) by mouth daily.   busPIRone (BUSPAR) 10 MG tablet Take 5 mg by mouth 2 (two) times daily.   cetirizine (ZYRTEC) 10 MG tablet Take 10 mg by mouth at bedtime.   clopidogrel (PLAVIX) 75 MG tablet Take 1 tablet (75 mg total) by mouth daily.   fluticasone (FLONASE) 50 MCG/ACT nasal spray Place 2 sprays into both nostrils daily as needed for allergies or rhinitis.   HYDROcodone-acetaminophen (NORCO/VICODIN) 5-325 MG tablet Take 1 tablet by mouth every 6 (six) hours as needed.   hydrOXYzine (ATARAX/VISTARIL) 10 MG tablet Take 5 mg by mouth daily as needed for anxiety.   ketotifen (ZADITOR) 0.025 % ophthalmic solution Place 2 drops into both eyes 2 (two) times daily as needed (alleriges).   montelukast (SINGULAIR) 10 MG tablet Take 10 mg by mouth at bedtime.   traZODone (DESYREL) 50 MG tablet Take 25 mg by mouth at bedtime.     Allergies:   Primaquine   Social History   Socioeconomic History   Marital status: Married    Spouse name: Not on file   Number of children: Not on file   Years of education: Not on file   Highest education level: Not on file  Occupational History   Not on file  Tobacco Use   Smoking status: Never    Passive exposure: Never   Smokeless tobacco: Never  Vaping Use   Vaping Use: Never used  Substance and Sexual Activity   Alcohol use: Yes   Drug use: Never   Sexual activity: Not on file  Other Topics Concern   Not on file  Social History Narrative    Left handed   Social Determinants of Health   Financial Resource Strain: Not on file  Food Insecurity: Not on file  Transportation Needs: Not on file  Physical Activity: Not on file  Stress: Not on file  Social Connections: Not on file    Family History: The patient's family history includes Dementia in his maternal grandfather and maternal grandmother.  ROS:   Please see the history of present illness.    All other systems reviewed and are negative.  EKGs/Labs/Other Studies Reviewed:    The following studies were reviewed today:  PFO closure 10/14/21:  Successful transcatheter PFO closure using a 25 mm Amplatzer Talisman PFO occluder device   Recommend: DAPT with ASA and plavix x 6 months SBE prophylaxis x 6 months as indicated Same day DC  protocol if criteria met  Vascular US 10/17/21:    Summary: No evidence of pseudoaneurysm, AVF or DVT  Hematoma measuring 7cm X 6cm noted.       EKG:  EKG is not ordered today.    Recent Labs: 09/07/2021: ALT 45; B Natriuretic Peptide 47.1; Magnesium 2.0 10/17/2021: BUN 11; Creatinine, Ser 1.02; Hemoglobin 12.5; Platelets 256; Potassium 3.8; Sodium 137   Recent Lipid Panel    Component Value Date/Time   CHOL 230 (H) 09/05/2021 0635   TRIG 115 09/05/2021 0635   HDL 49 09/05/2021 0635   CHOLHDL 4.7 09/05/2021 0635   VLDL 23 09/05/2021 0635   LDLCALC 158 (H) 09/05/2021 0635   Physical Exam:    VS:  BP 120/90    Pulse (!) 113    Ht $R'5\' 9"'sA$  (1.753 m)    Wt 252 lb (114.3 kg)    SpO2 97%    BMI 37.21 kg/m     Wt Readings from Last 3 Encounters:  10/18/21 252 lb (114.3 kg)  10/16/21 252 lb (114.3 kg)  10/14/21 252 lb (114.3 kg)   General: Well developed, well nourished, NAD Lungs:Clear to ausculation bilaterally. Breathing is unlabored. Cardiovascular: RRR with S1 S2. No murmurs Extremities: No edema.  Neuro: Alert and oriented. No focal deficits. No facial asymmetry. MAE spontaneously. Psych: Responds to questions appropriately  with normal affect.    ASSESSMENT/PLAN:     Right groin hematoma: s/p PFO closure 10/14/21. Noted to have post procedural hematoma, treated with groin compression. After stabilized, the patient was discharged home however called the office Friday 3/3 with complaints of worsening right groin ecchymosis and pain. He then presented to the ED over the weekend at which time US performed that showed no pseudoaneurysm however with a hematoma measuring 7cm X 6cm. CBC with Hb at 12.5>>previously 15.4. Today there is profound right groin ecchymosis medically and laterally and down his medial thigh. There is a definite ridge at the hematoma site with significant pain with palpation. Area was marked during ED assessment and does not appear to have grown. He does report mild numbness along the medical thigh, likely from nerve compression from hematoma. Dr. Ali Lowe in the office today who came to assess the site. My first thought was to schedule repeat US follow up in a week or two however Dr. Ali Lowe does not think this is necessary. ? Arterial nick? He is ambulating with crutches due to significant pain. I have encouraged that he continue this. He will also need to continue with leg elevation and groin splinting with coughing or straining. Tylenol for pain. Patient remains on   PFO: Underwent PFO closure 10/14/21 with using a 25 mm Amplatzer Talisman PFO occluder device. Plan for DAPT with ASA and plavix x 6 months. SBE prophylaxis x 6 months as indicated>>not discussed today therefore will need to address at next follow up and will call the patient to inform as well.    Medication Adjustments/Labs and Tests Ordered: Current medicines are reviewed at length with the patient today.  Concerns regarding medicines are outlined above.  No orders of the defined types were placed in this encounter.  No orders of the defined types were placed in this encounter.   Patient Instructions  Medication Instructions:  Your  physician recommends that you continue on your current medications as directed. Please refer to the Current Medication list given to you today.  *If you need a refill on your cardiac medications before your next appointment, please call your  pharmacy*   Lab Work: NONE If you have labs (blood work) drawn today and your tests are completely normal, you will receive your results only by: [place] (if you have MyChart) OR A paper copy in the mail If you have any lab test that is abnormal or we need to change your treatment, we will call you to review the results.   Testing/Procedures: NONE   Follow-Up: At [facility], you and your health needs are our priority.  As part of our continuing mission to provide you with exceptional heart care, we have created designated Provider Care Teams.  These Care Teams include your primary Cardiologist (physician) and Advanced Practice Providers (APPs -  Physician Assistants and Nurse Practitioners) who all work together to provide you with the care you need, when you need it.  We recommend signing up for the patient portal called "MyChart".  Sign up information is provided on this After Visit Summary.  MyChart is used to connect with patients for Virtual Visits (Telemedicine).  Patients are able to view lab/test results, encounter notes, upcoming appointments, etc.  Non-urgent messages can be sent to your provider as well.   To learn more about what you can do with MyChart, go to [email or web address].    Your next appointment:   KEEP SCHEDULED FOLLOW-UP   Signed, Kathyrn Drown, NP  10/19/2021 8:57 AM    [place]

## 2021-10-18 NOTE — Telephone Encounter (Signed)
Patient was in the emergency room over the weekend, they advised him to try to get his appt moved up that he has scheduled with Dr. Excell Seltzer on 11/15/21. ?

## 2021-10-18 NOTE — Telephone Encounter (Signed)
Scheduled the patient for office visit with Georgie Chard today, 10/18/2021. ?The patient was grateful for call and agrees with plan.  ?

## 2021-10-18 NOTE — Patient Instructions (Signed)
Medication Instructions:  ?Your physician recommends that you continue on your current medications as directed. Please refer to the Current Medication list given to you today.  ?*If you need a refill on your cardiac medications before your next appointment, please call your pharmacy* ? ? ?Lab Work: ?NONE ?If you have labs (blood work) drawn today and your tests are completely normal, you will receive your results only by: ?MyChart Message (if you have MyChart) OR ?A paper copy in the mail ?If you have any lab test that is abnormal or we need to change your treatment, we will call you to review the results. ? ? ?Testing/Procedures: ?NONE ? ? ?Follow-Up: ?At [facility], you and your health needs are our priority.  As part of our continuing mission to provide you with exceptional heart care, we have created designated Provider Care Teams.  These Care Teams include your primary Cardiologist (physician) and Advanced Practice Providers (APPs -  Physician Assistants and Nurse Practitioners) who all work together to provide you with the care you need, when you need it. ? ?We recommend signing up for the patient portal called "MyChart".  Sign up information is provided on this After Visit Summary.  MyChart is used to connect with patients for Virtual Visits (Telemedicine).  Patients are able to view lab/test results, encounter notes, upcoming appointments, etc.  Non-urgent messages can be sent to your provider as well.   ?To learn more about what you can do with MyChart, go to [email or web address].   ? ?Your next appointment:   ?KEEP SCHEDULED FOLLOW-UP ?

## 2021-10-18 NOTE — Telephone Encounter (Signed)
See note from [facility]. ?

## 2021-10-22 ENCOUNTER — Telehealth: Payer: Self-pay

## 2021-10-22 NOTE — Telephone Encounter (Signed)
Called patient for an update. He states his groin bruising and pain are improving day by day.  ?He still has a knot and understands that will take some time to resolve.  ?He understands to call if he has any questions or concerns prior to follow-up 11/15/21. ?He was grateful for call. ?

## 2021-11-08 ENCOUNTER — Ambulatory Visit: Payer: No Typology Code available for payment source

## 2021-11-12 ENCOUNTER — Ambulatory Visit: Payer: No Typology Code available for payment source | Admitting: Neurology

## 2021-11-12 NOTE — Progress Notes (Signed)
?[facility]   ?[place] ?                                    ?Cardiology Office Note:   ? ?Date:  11/15/2021  ? ?ID:  Bobby Haynes, DOB 10-30-1990, MRN [id number] ? ?PCP:  [place] Cardiologist:  Sherren Mocha, MD  ?[place] Electrophysiologist:  None  ? ?Referring MD: [place]  ? ?Chief Complaint  ?Patient presents with  ? Follow-up  ?  S/p PFO closure   ? ?History of Present Illness:   ? ?Bobby Haynes is a 31 y.o. male with a hx of obesity and migraines who was referred to Dr. Burt Knack for PFO closure which was performed 10/14/21.  ?  ?[name] presented to the [facility] on 09/05/2021 for evaluation of transient right-sided numbness, aphagia and blurred vision while having a bowel movement. Head CT showed no acute intracranial abnormality. 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 MRA showed wide patency of both carotid arteries and R vertebral artery diffusely hypoplastic and terminates in PICA, dominant L vertebral artery. CTA showed no abnormal anterior or posterior circulation. Echocardiogram showed an EF 60 to 65% with a negative bubble study. Bilateral lower extremity Dopplers were negative for DVT. Hypercoagulable work-up has been negative. TEE on 09/06/2021 showed EF 60 to 65% with evidence of atrial level shunting by color-flow Doppler as well as a moderately sized PFO with predominantly right to left shunting across the atrial septum. ?  ?He was seen by Dr. Burt Knack with plans to proceed with PFO closure which was performed 10/14/21 with 25 mm Amplatzer Talisman PFO occluder device ?Recommendations are for DAPT with ASA and plavix x 6 months.SBE prophylaxis discussed however he wishes to defer dental cleanings until after 6 months.   ?  ?The patient then called the office Friday 10/15/21 in the late afternoon with c/o increased groin  bruising. Unfortunately this was around 5pm therefore no ability to schedule for OP [place] at our [place] office. ED precautions were reviewed with him. Plan was to schedule closer follow up. Due to worsening groin ecchymosis, he presented to the ED at which time right groin ultrasound imaging was without evidence of pseudoaneurysm or other significant complication although there was presence of a hematoma measuring 7cm X 6cm. CBC with Hb at 12.5>>previously 15.4. All other labs were noted to be stable. EDP discussed case with Dr. Sallyanne Kuster and patient felt to be appropriate for discharge.  ?  ?I then saw him in follow up at which time he was noted to have profound right groin ecchymosis medially and laterally and down his medial thigh. Dr. Ali Lowe was present in the office who came to see the groin. No follow up imaging was made due to prior US as above.  ? ?Today he is here and reports that he has been doing very well. His groin bruising is almost entirely gone. He has some very mild residual posterior leg numbness however this too is improving. He has lots of questions about returning to weightlifting. I asked that he resume this slowly with no major weights until at least the 6-8 week timeframe. He denies chest pain, SOB, LE edema, no neuro changes. He has some very mild fatigue but thinks this is related to inactivity for the last several months.  ? ?  Past Medical History:  ?Diagnosis Date  ? Depression   ? GERD (gastroesophageal reflux disease)   ? Insomnia   ? ? ?Past Surgical History:  ?Procedure Laterality Date  ? BUBBLE STUDY  09/06/2021  ? Procedure: BUBBLE STUDY;  Surgeon: Jerline Pain, MD;  Location: [facility] ENDOSCOPY;  Service: Cardiovascular;;  ? PATENT FORAMEN OVALE(PFO) CLOSURE N/A 10/14/2021  ? Procedure: PATENT FORAMEN OVALE (PFO) CLOSURE;  Surgeon: Sherren Mocha, MD;  Location: [place] CV LAB;  Service: Cardiovascular;  Laterality: N/A;  ? TEE WITHOUT CARDIOVERSION N/A 09/06/2021  ? Procedure:  TRANSESOPHAGEAL ECHOCARDIOGRAM (TEE);  Surgeon: Jerline Pain, MD;  Location: [facility] ENDOSCOPY;  Service: Cardiovascular;  Laterality: N/A;  ? ? ?Current Medications: ?Current Meds  ?Medication Sig  ? acetaminophen (TYLENOL) 500 MG tablet Take 1,000 mg by mouth every 6 (six) hours as needed for moderate pain.  ? albuterol (VENTOLIN HFA) 108 (90 Base) MCG/ACT inhaler Inhale 2 puffs into the lungs every 6 (six) hours as needed for shortness of breath or wheezing.  ? aspirin EC 81 MG EC tablet Take 1 tablet (81 mg total) by mouth daily. Swallow whole.  ? atorvastatin (LIPITOR) 40 MG tablet Take 1 tablet (40 mg total) by mouth daily.  ? busPIRone (BUSPAR) 10 MG tablet Take 5 mg by mouth 2 (two) times daily.  ? cetirizine (ZYRTEC) 10 MG tablet Take 10 mg by mouth at bedtime.  ? clopidogrel (PLAVIX) 75 MG tablet Take 1 tablet (75 mg total) by mouth daily.  ? fluticasone (FLONASE) 50 MCG/ACT nasal spray Place 2 sprays into both nostrils daily as needed for allergies or rhinitis.  ? HYDROcodone-acetaminophen (NORCO/VICODIN) 5-325 MG tablet Take 1 tablet by mouth every 6 (six) hours as needed.  ? hydrOXYzine (ATARAX/VISTARIL) 10 MG tablet Take 5 mg by mouth daily as needed for anxiety.  ? ketotifen (ZADITOR) 0.025 % ophthalmic solution Place 2 drops into both eyes 2 (two) times daily as needed (alleriges).  ? montelukast (SINGULAIR) 10 MG tablet Take 10 mg by mouth at bedtime.  ? traZODone (DESYREL) 50 MG tablet Take 25 mg by mouth at bedtime.  ?  ? ?Allergies:   Primaquine  ? ?Social History  ? ?Socioeconomic History  ? Marital status: Married  ?  Spouse name: Not on file  ? Number of children: Not on file  ? Years of education: Not on file  ? Highest education level: Not on file  ?Occupational History  ? Not on file  ?Tobacco Use  ? Smoking status: Never  ?  Passive exposure: Never  ? Smokeless tobacco: Never  ?Vaping Use  ? Vaping Use: Never used  ?Substance and Sexual Activity  ? Alcohol use: Yes  ? Drug use: Never  ?  Sexual activity: Not on file  ?Other Topics Concern  ? Not on file  ?Social History Narrative  ? Left handed  ? ?Social Determinants of Health  ? ?Financial Resource Strain: Not on file  ?Food Insecurity: Not on file  ?Transportation Needs: Not on file  ?Physical Activity: Not on file  ?Stress: Not on file  ?Social Connections: Not on file  ?  ? ?Family History: ?The patient's family history includes Dementia in his maternal grandfather and maternal grandmother. ? ?ROS:   ?Please see the history of present illness.    ?All other systems reviewed and are negative. ? ?EKGs/Labs/Other Studies Reviewed:   ? ?The following studies were reviewed today: ? ?PFO closure 10/14/21: ?  ?Successful transcatheter PFO closure using a  25 mm Amplatzer Talisman PFO occluder device ?  ?Recommend: ?DAPT with ASA and plavix x 6 months ?SBE prophylaxis x 6 months as indicated ?Same day DC protocol if criteria met ?  ?Vascular US 10/17/21: ?   ?Summary: No evidence of pseudoaneurysm, AVF or DVT  ?Hematoma measuring 7cm X 6cm noted.  ?   ?EKG:  EKG is not ordered today.   ?Recent Labs: ?09/07/2021: ALT 45; B Natriuretic Peptide 47.1; Magnesium 2.0 ?10/17/2021: BUN 11; Creatinine, Ser 1.02; Hemoglobin 12.5; Platelets 256; Potassium 3.8; Sodium 137  ?Recent Lipid Panel ?   ?Component Value Date/Time  ? CHOL 230 (H) 09/05/2021 1937  ? TRIG 115 09/05/2021 0635  ? HDL 49 09/05/2021 0635  ? CHOLHDL 4.7 09/05/2021 0635  ? VLDL 23 09/05/2021 0635  ? [place] 158 (H) 09/05/2021 9024  ? ?Physical Exam:   ? ?VS:  BP 122/82   Pulse 88   Ht $R'5\' 9"'Ru$  (1.753 m)   Wt 255 lb 6.4 oz (115.8 kg)   SpO2 97%   BMI 37.72 kg/m?    ? ?Wt Readings from Last 3 Encounters:  ?11/15/21 255 lb 6.4 oz (115.8 kg)  ?10/18/21 252 lb (114.3 kg)  ?10/16/21 252 lb (114.3 kg)  ?  ?General: Well developed, well nourished, NAD ?Skin: Groin site stable with no s/s of hematoma  ?Lungs:Clear to ausculation bilaterally. No wheezes, rales, or rhonchi. Breathing is  unlabored. ?Cardiovascular: RRR with S1 S2. No murmurs ?Extremities: No edema.  ?Neuro: Alert and oriented. No focal deficits. No facial asymmetry. MAE spontaneously. ?Psych: Responds to questions appropriately with normal affect.   ? ?ASSESSMENT/PLAN:

## 2021-11-15 ENCOUNTER — Ambulatory Visit (INDEPENDENT_AMBULATORY_CARE_PROVIDER_SITE_OTHER): Payer: No Typology Code available for payment source | Admitting: Cardiology

## 2021-11-15 VITALS — BP 122/82 | HR 88 | Ht 69.0 in | Wt 255.4 lb

## 2021-11-15 DIAGNOSIS — I639 Cerebral infarction, unspecified: Secondary | ICD-10-CM | POA: Diagnosis not present

## 2021-11-15 DIAGNOSIS — Q2112 Patent foramen ovale: Secondary | ICD-10-CM

## 2021-11-15 NOTE — Patient Instructions (Signed)
Medication Instructions:  ?Your physician has recommended you make the following change in your medication:  ?STOP PLAVIX ON 9/2, AND CONTINUE ON ASPIRIN.  ?*If you need a refill on your cardiac medications before your next appointment, please call your pharmacy* ? ? ?Lab Work: ?NONE ?If you have labs (blood work) drawn today and your tests are completely normal, you will receive your results only by: ?MyChart Message (if you have MyChart) OR ?A paper copy in the mail ?If you have any lab test that is abnormal or we need to change your treatment, we will call you to review the results. ? ? ?Testing/Procedures: ?Your physician has requested that you have an LIMITED echocardiogram WITH BUBBLE. Echocardiography is a painless test that uses sound waves to create images of your heart. It provides your doctor with information about the size and shape of your heart and how well your heart?s chambers and valves are working. This procedure takes approximately one hour. There are no restrictions for this procedure.  ? ? ?Follow-Up: ?At [facility], you and your health needs are our priority.  As part of our continuing mission to provide you with exceptional heart care, we have created designated Provider Care Teams.  These Care Teams include your primary Cardiologist (physician) and Advanced Practice Providers (APPs -  Physician Assistants and Nurse Practitioners) who all work together to provide you with the care you need, when you need it. ? ?We recommend signing up for the patient portal called "MyChart".  Sign up information is provided on this After Visit Summary.  MyChart is used to connect with patients for Virtual Visits (Telemedicine).  Patients are able to view lab/test results, encounter notes, upcoming appointments, etc.  Non-urgent messages can be sent to your provider as well.   ?To learn more about what you can do with MyChart, go to [email or web address].   ? ?Your next appointment:   ?1 year(s)  (September 2023) ? ?The format for your next appointment:   ?In Person ? ?Provider:   ?Kathyrn Drown, NP  ? ? ? ?

## 2021-11-22 ENCOUNTER — Telehealth: Payer: Self-pay | Admitting: Cardiovascular Disease

## 2021-11-22 NOTE — Telephone Encounter (Signed)
Right leg burning feeling from his thigh to his knee. Please advise  ?

## 2021-11-22 NOTE — Telephone Encounter (Signed)
Please let him know that this sounds like nerve pain as we have talked about before. This can take some time to heal, a much slower process than tissue pain/swelling. Lets see what the lidocaine patches do for it and let us know.  ? ?Thank you  ?

## 2021-11-22 NOTE — Telephone Encounter (Signed)
Relayed advice to patient. Also educated that repetitive, or excessive movements can cause nerve inflammation so he should take things slower, or change positions slowly, just seek more rest-that he may be overdoing things. Pt states understanding and that he will let us know in a month or more if he is still not feeling better. ?

## 2021-11-22 NOTE — Telephone Encounter (Signed)
Returned call to patient who condones R thigh pain that radiates to popliteal area. Says he had some pain occurences yesterday, but worse today. Located inside groin, describes as "burning and sharp, worse with walking." Lifted weights yesterday "wasn't anything heavy" denies squats or deadlifts, only upper body workout. No redness, swelling, or bruising. [facility] and they gave him a lidocaine patch to place on area, denies pain at this moment, but states it's intermittent in nature. Wanted to make [place] aware and ask for recommendations.  ?

## 2021-12-01 ENCOUNTER — Ambulatory Visit (HOSPITAL_COMMUNITY): Payer: No Typology Code available for payment source | Attending: Internal Medicine

## 2021-12-01 ENCOUNTER — Other Ambulatory Visit: Payer: Self-pay | Admitting: Cardiology

## 2021-12-01 ENCOUNTER — Ambulatory Visit: Payer: No Typology Code available for payment source | Admitting: Neurology

## 2021-12-01 DIAGNOSIS — I639 Cerebral infarction, unspecified: Secondary | ICD-10-CM

## 2021-12-01 DIAGNOSIS — Q2112 Patent foramen ovale: Secondary | ICD-10-CM | POA: Insufficient documentation

## 2021-12-01 LAB — ECHOCARDIOGRAM LIMITED BUBBLE STUDY
Area-P 1/2: 4.15 cm2
S' Lateral: 3.3 cm

## 2022-01-12 NOTE — Progress Notes (Signed)
Renew Referral faxed to [facility].

## 2022-01-19 ENCOUNTER — Encounter (HOSPITAL_COMMUNITY): Payer: Self-pay | Admitting: Emergency Medicine

## 2022-01-19 ENCOUNTER — Emergency Department (HOSPITAL_COMMUNITY)
Admission: EM | Admit: 2022-01-19 | Discharge: 2022-01-20 | Disposition: A | Discharge status: Home / Self Care | Payer: No Typology Code available for payment source | Attending: Emergency Medicine | Admitting: Emergency Medicine

## 2022-01-19 ENCOUNTER — Emergency Department (HOSPITAL_COMMUNITY): Payer: No Typology Code available for payment source

## 2022-01-19 ENCOUNTER — Other Ambulatory Visit: Payer: Self-pay

## 2022-01-19 DIAGNOSIS — R110 Nausea: Secondary | ICD-10-CM | POA: Diagnosis not present

## 2022-01-19 DIAGNOSIS — R0602 Shortness of breath: Secondary | ICD-10-CM | POA: Insufficient documentation

## 2022-01-19 DIAGNOSIS — Z7901 Long term (current) use of anticoagulants: Secondary | ICD-10-CM | POA: Diagnosis not present

## 2022-01-19 DIAGNOSIS — R079 Chest pain, unspecified: Secondary | ICD-10-CM | POA: Diagnosis present

## 2022-01-19 DIAGNOSIS — R059 Cough, unspecified: Secondary | ICD-10-CM | POA: Diagnosis not present

## 2022-01-19 DIAGNOSIS — Z7982 Long term (current) use of aspirin: Secondary | ICD-10-CM | POA: Diagnosis not present

## 2022-01-19 DIAGNOSIS — R000 Tachycardia, unspecified: Secondary | ICD-10-CM | POA: Insufficient documentation

## 2022-01-19 DIAGNOSIS — R002 Palpitations: Secondary | ICD-10-CM | POA: Insufficient documentation

## 2022-01-19 DIAGNOSIS — R519 Headache, unspecified: Secondary | ICD-10-CM | POA: Diagnosis not present

## 2022-01-19 HISTORY — DX: Cerebral infarction, unspecified: I63.9

## 2022-01-19 LAB — CBC WITH DIFFERENTIAL/PLATELET
Abs Immature Granulocytes: 0.03 10*3/uL (ref 0.00–0.07)
Basophils Absolute: 0.1 10*3/uL (ref 0.0–0.1)
Basophils Relative: 1 %
Eosinophils Absolute: 0.2 10*3/uL (ref 0.0–0.5)
Eosinophils Relative: 2 %
HCT: 44.4 % (ref 39.0–52.0)
Hemoglobin: 14.6 g/dL (ref 13.0–17.0)
Immature Granulocytes: 0 %
Lymphocytes Relative: 20 %
Lymphs Abs: 1.9 10*3/uL (ref 0.7–4.0)
MCH: 29.1 pg (ref 26.0–34.0)
MCHC: 32.9 g/dL (ref 30.0–36.0)
MCV: 88.4 fL (ref 80.0–100.0)
Monocytes Absolute: 0.7 10*3/uL (ref 0.1–1.0)
Monocytes Relative: 7 %
Neutro Abs: 6.6 10*3/uL (ref 1.7–7.7)
Neutrophils Relative %: 70 %
Platelets: 339 10*3/uL (ref 150–400)
RBC: 5.02 MIL/uL (ref 4.22–5.81)
RDW: 12 % (ref 11.5–15.5)
WBC: 9.4 10*3/uL (ref 4.0–10.5)
nRBC: 0 % (ref 0.0–0.2)

## 2022-01-19 LAB — COMPREHENSIVE METABOLIC PANEL
ALT: 35 U/L (ref 0–44)
AST: 24 U/L (ref 15–41)
Albumin: 4 g/dL (ref 3.5–5.0)
Alkaline Phosphatase: 89 U/L (ref 38–126)
Anion gap: 8 (ref 5–15)
BUN: 15 mg/dL (ref 6–20)
CO2: 25 mmol/L (ref 22–32)
Calcium: 9.8 mg/dL (ref 8.9–10.3)
Chloride: 104 mmol/L (ref 98–111)
Creatinine, Ser: 1.01 mg/dL (ref 0.61–1.24)
GFR, Estimated: >60 mL/min (ref >60)
Glucose, Bld: 124 mg/dL — ABNORMAL HIGH (ref 70–99)
Potassium: 3.8 mmol/L (ref 3.5–5.1)
Sodium: 137 mmol/L (ref 135–145)
Total Bilirubin: 0.4 mg/dL (ref 0.3–1.2)
Total Protein: 7.3 g/dL (ref 6.5–8.1)

## 2022-01-19 LAB — TROPONIN I (HIGH SENSITIVITY): Troponin I (High Sensitivity): 6 ng/L (ref <18)

## 2022-01-19 MED ORDER — ACETAMINOPHEN 325 MG PO TABS
650.0000 mg | ORAL_TABLET | Freq: Once | ORAL | Status: AC
  Administered 2022-01-19: 650 mg via ORAL
  Filled 2022-01-19: qty 2

## 2022-01-19 NOTE — ED Triage Notes (Signed)
Patient reports intermittent central chest pain with SOB and occasional productive cough for several days , no emesis or diaphoresis .

## 2022-01-19 NOTE — ED Provider Triage Note (Signed)
Emergency Medicine Provider Triage Evaluation Note  Harshal Vayda , a 31 y.o. male  was evaluated in triage.  Pt complains of substernal chest pain that radiates to the back.  He does have history of stroke, and PFO closure.  No residual deficits from the stroke.  Pain ongoing for 2 days.  Started at rest.  No particular alleviating or aggravating factors.  No significant personal or family cardiac history.  Review of Systems  Positive: As above Negative: As above  Physical Exam  BP (!) 153/103 (BP Location: Right Arm)   Pulse (!) 109   Resp 17   Ht 5\' 9"  (1.753 m)   Wt 125 kg   SpO2 96%   BMI 40.70 kg/m  Gen:   Awake, no distress   Resp:  Normal effort  MSK:   Moves extremities without difficulty  Other:    Medical Decision Making  Medically screening exam initiated at 10:11 PM.  Appropriate orders placed.  Shahzad Bernat was informed that the remainder of the evaluation will be completed by another provider, this initial triage assessment does not replace that evaluation, and the importance of remaining in the ED until their evaluation is complete.     , PA-C 01/19/22 2212

## 2022-01-19 NOTE — Progress Notes (Deleted)
NEUROLOGY FOLLOW UP OFFICE NOTE  Amy Musleh [id number]  Assessment/Plan:   1   Migraine with aura, without status migrainosus, not intractable 2  Tension-type headache, not intractable 3.  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  Will try lifestyle modification and possibly vitamins/supplements (magnesium citrate 400mg  daily, riboflavin 400mg  daily, CoQ10 100mg  three times daily Migraine rescue:  rizatriptan 10mg  (Zofran for nausea) Limit use of pain relievers to no more than 2 days out of week to prevent risk of rebound or medication-overuse headache. Keep headache diary Follow up 6 months.       Subjective:  Bobby Haynes is a 31 year old left-handed male who follows up for headaches and ***.  UPDATE: In October, he chose to work on lifestyle modification to improve headaches.  He was prescribed rizatriptan but advised to discontinue after having the stroke. He has been treating them with Tylenol.  Migraines Intensity:  *** Duration:  *** Frequency:  ***  2.  Tension type headache Intensity:  *** Duration:  *** Frequency:  ***  3.  Cardioembolic stroke ***  Frequency of abortive medication: *** Rescue protocol:  Excedrin (sometimes ibuprofen), Benadryl, Zofran Frequency of pain relievers: 3 to 4 times a week Current NSAIDS/analgesics:  ibuprofen, Tylenol, Excedrin Current triptans:  none Current ergotamine:  none Current anti-emetic:  Zofran ODT 4mg  Current muscle relaxants:  none Current Antihypertensive medications:  none Current Antidepressant medications:  trazodone 50mg  daily Current Anticonvulsant medications:  none Current anti-CGRP:  none Current Vitamins/Herbal/Supplements:  none Current Antihistamines/Decongestants:  Benadryl, hydroxyzine Other therapy:  none Hormone/birth control:  none Other medications:  Buspar  Caffeine:  3  cups coffee daily.  Soda not daily Diet:  1 gallon of water daily.  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 Location:  across forehead Quality: pounding Intensity:  5-6/10.   Aura:  sometimes preceded by blurred vision Prodrome:  absent Associated symptoms:  Nausea, photophobia, phonophobia.  He denies associated vomiting, autonomic symptoms, unilateral numbness or weakness. Duration:  several months Frequency:  varies but on average once a month Triggers:  change in weather Relieving factors:  rest Activity:  aggravates   September, he had a persistent migraine for 2 weeks.  Went to ED and found to have elevated blood pressure.   2.  Tension-type Headache Onset:  2021 after contracting Covid Location:  across forehead Quality:  throbbing, sometimes pressure Intensity:  3-4/10.   Aura:  absent Prodrome:  absent Associated symptoms:  None.   Duration:  all day Frequency:  every other day  3.  Cardioembolic stroke in setting of PFO On 09/04/2021 he was on the toilet having a bowel movement when he developed blurred vision and blacked out for a couple of seconds.  He woke up with left arm numbness and weakness.  He then developed left sided facial droop and slurred speech.  Symptoms resolved after 5 minutes.  He went to [facility] ED where CT head showed no acute intracranial abnormality but follow up MRI of brain showed 6 mm acute ischemic cortical infarct involving the right parietal lobe/postcentral gyrus.  CTA head showed no hemodynamically significant stenosis or occlusion.  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  Telemetry revealed no afib.   Hypercoagulable panel was negative.  Hgb A1c was 5.8 and LDL was 158.  2D echo showed EF 60-65% with negative bubble study but TEE was positive for PFO.  He was discharged on ASA 81mg  and Plavix 75mg  daily as well as started on Lipitor 40mg  daily.  He is going to follow up with Dr. Excell Seltzerooper of cardiology to discuss PFO closure.  Scheduled for closure on 2/22.  When gets fatigued midday body feels weak.  A couple of times face numb and left side felt numb again, likely recrudescence of previous symptoms.  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      Past NSAIDS/analgesics:  Tylenol Past abortive triptans:  rizatriptan.  Contraindicated due to history of stroke. Past abortive ergotamine:  none Past muscle relaxants:  none Past anti-emetic:  none Past antihypertensive medications:  none Past antidepressant medications:  none Past anticonvulsant medications:  none Past anti-CGRP:  none Past vitamins/Herbal/Supplements:  none Past antihistamines/decongestants:  none Other past therapies:  none    Family history of headache:  mom and dad (migraines)  PAST MEDICAL HISTORY: Past Medical History:  Diagnosis Date   Depression    GERD (gastroesophageal reflux disease)    Insomnia     MEDICATIONS: Current Outpatient Medications on File Prior to Visit  Medication Sig Dispense Refill   acetaminophen (TYLENOL) 500 MG tablet Take 1,000 mg by mouth every 6 (six) hours as needed for moderate pain.     albuterol (VENTOLIN HFA) 108 (90 Base) MCG/ACT inhaler Inhale 2 puffs into the lungs every 6 (six) hours as needed for shortness of breath or wheezing.     aspirin EC 81 MG EC tablet Take 1 tablet (81 mg total) by mouth daily. Swallow whole. 30 tablet 0   atorvastatin (LIPITOR) 40 MG tablet Take 1 tablet (40 mg total) by mouth daily. 30 tablet 0   busPIRone (BUSPAR) 10 MG tablet Take 5 mg by mouth 2 (two) times daily.     cetirizine  (ZYRTEC) 10 MG tablet Take 10 mg by mouth at bedtime.     clopidogrel (PLAVIX) 75 MG tablet Take 1 tablet (75 mg total) by mouth daily. 30 tablet 0   fluticasone (FLONASE) 50 MCG/ACT nasal spray Place 2 sprays into both nostrils daily as needed for allergies or rhinitis.     HYDROcodone-acetaminophen (NORCO/VICODIN) 5-325 MG tablet Take 1 tablet by mouth every 6 (six) hours as needed. 8 tablet 0   hydrOXYzine (ATARAX/VISTARIL) 10 MG tablet Take 5 mg by mouth daily as needed for anxiety.     ketotifen (ZADITOR) 0.025 % ophthalmic solution Place 2 drops into both eyes 2 (two) times daily as needed (alleriges).     montelukast (SINGULAIR) 10 MG tablet Take 10 mg by mouth at bedtime.     traZODone (DESYREL) 50 MG tablet Take 25 mg by mouth at bedtime.     No current facility-administered medications on file prior to visit.    ALLERGIES: Allergies  Allergen Reactions   Primaquine     Unknown reaction    FAMILY HISTORY: Family History  Problem Relation Age of Onset   Dementia Maternal Grandmother    Dementia Maternal Grandfather       Objective:  *** General: No acute distress.  Patient appears ***-groomed.   Head:  Normocephalic/atraumatic Eyes:  Fundi examined but not visualized Neck: supple, no paraspinal tenderness, full range of motion Heart:  Regular rate and rhythm Lungs:  Clear to auscultation bilaterally Back: No paraspinal tenderness Neurological Exam: alert and oriented to person, place, and time.  Speech fluent and not dysarthric, language intact.  CN II-XII intact. Bulk and tone normal, muscle strength 5/5 throughout.  Sensation to light touch intact.  Deep tendon reflexes 2+ throughout, toes downgoing.  Finger to nose testing intact.  Gait normal, Romberg negative.   Shon Millet, DO  CC: ***

## 2022-01-19 NOTE — ED Provider Notes (Signed)
[facility] EMERGENCY DEPARTMENT Provider Note   CSN: [id number] Arrival date & time: 01/19/22  2156     History  Chief Complaint  Patient presents with   Chest Pain    Bobby Haynes is a 31 y.o. male who presents with his wife at the bedside with concern for 4 days of ongoing intermittent central chest pain that feels like a sharp stabbing pain, last for a few seconds at a time and resolves on its own.  Associated shortness of breath with onset of pain but no exertional component.  Patient does endorse palpitations feeling his heart skipping beats as well.  He has had intermittent productive cough for the last several days with mild nausea but no vomiting, diarrhea, fevers, or chills.  I personally reviewed the patient's medical records.  He is history of acute embolic stroke in context of PFO  status post closure in March 2023 now on antiplatelet therapy with aspirin and Plavix.  States ongoing memory problems and fatigue but no other persistent deficits embolic [facility] was [facility] to the ED by the triage nurse given ongoing chest pain.  Denies any known alleviating or exacerbating factors.  Does also endorse concomitant mild headache at this time in context of migraines.  Patient currently on doxycycline for sinusitis BuSpar, montelukast, trazodone daily in addition to aspirin and Plavix.  HPI     Home Medications Prior to Admission medications   Medication Sig Start Date End Date Taking? Authorizing Provider  acetaminophen (TYLENOL) 500 MG tablet Take 1,000 mg by mouth every 6 (six) hours as needed for moderate pain.   Yes [provider]  albuterol (VENTOLIN HFA) 108 (90 Base) MCG/ACT inhaler Inhale 2 puffs into the lungs every 6 (six) hours as needed for shortness of breath or wheezing. 09/21/20  Yes [provider]  aspirin EC 81 MG EC tablet Take 1 tablet (81 mg total) by mouth daily. Swallow whole. 09/08/21   Yes Leroy Sea, MD  atorvastatin (LIPITOR) 40 MG tablet Take 1 tablet (40 mg total) by mouth daily. Patient taking differently: Take 40 mg by mouth at bedtime. 09/08/21  Yes Leroy Sea, MD  benzonatate (TESSALON) 100 MG capsule Take 100 mg by mouth 3 (three) times daily as needed for cough. 01/18/22  Yes [provider]  busPIRone (BUSPAR) 10 MG tablet Take 5 mg by mouth 2 (two) times daily. 04/14/21  Yes [provider]  cetirizine (ZYRTEC) 10 MG tablet Take 10 mg by mouth at bedtime.   Yes [provider]  Chlorphen-Pseudoephed-APAP (THERAFLU FLU/COLD PO) Take 30 mLs by mouth every 6 (six) hours as needed (cold symptoms).   Yes [provider]  chlorpheniramine (CHLOR-TRIMETON) 4 MG tablet Take 4 mg by mouth every 4 (four) hours as needed (nasal drainage). 01/18/22  Yes [provider]  clopidogrel (PLAVIX) 75 MG tablet Take 1 tablet (75 mg total) by mouth daily. 09/08/21  Yes Leroy Sea, MD  doxycycline (MONODOX) 100 MG capsule Take 100 mg by mouth See admin instructions. Bid x 7 days 01/18/22  Yes [provider]  fluticasone (FLONASE) 50 MCG/ACT nasal spray Place 2 sprays into both nostrils daily as needed for allergies or rhinitis.   Yes [provider]  hydrOXYzine (ATARAX/VISTARIL) 10 MG tablet Take 5 mg by mouth daily as needed for anxiety. 04/14/21  Yes [provider]  ketotifen (ZADITOR) 0.025 % ophthalmic solution Place 2 drops into both eyes 2 (  two) times daily as needed (alleriges).   Yes [provider]  Magnesium 400 MG TABS Take 400 mg by mouth daily.   Yes [provider]  montelukast (SINGULAIR) 10 MG tablet Take 10 mg by mouth at bedtime. 03/01/21  Yes [provider]  Multiple Vitamins-Minerals (EMERGEN-C IMMUNE) PACK Take 1 packet by mouth 2 (two) times daily as needed (immune support).   Yes [provider]  pseudoephedrine (SUDAFED) 60 MG tablet Take 60 mg by  mouth every 6 (six) hours as needed for congestion. 01/18/22  Yes [provider]  Pseudoephedrine-APAP-DM (DAYQUIL PO) Take 30 mLs by mouth every 6 (six) hours as needed (cold symptoms).   Yes [provider]  traZODone (DESYREL) 50 MG tablet Take 25 mg by mouth at bedtime. 04/14/21  Yes [provider]  zinc gluconate 50 MG tablet Take 50 mg by mouth daily.   Yes [provider]  HYDROcodone-acetaminophen (NORCO/VICODIN) 5-325 MG tablet Take 1 tablet by mouth every 6 (six) hours as needed. Patient not taking: Reported on 01/19/2022 10/17/21   Wynetta Fines, MD      Allergies    Primaquine    Review of Systems   Review of Systems  Constitutional: Negative.   HENT: Negative.    Eyes: Negative.   Respiratory:  Positive for shortness of breath.   Cardiovascular:  Positive for chest pain and palpitations.  Gastrointestinal:  Positive for nausea. Negative for abdominal pain, diarrhea and vomiting.  Genitourinary: Negative.   Musculoskeletal: Negative.   Skin: Negative.   Neurological:  Positive for headaches. Negative for weakness.    Physical Exam Updated Vital Signs BP 127/88   Pulse 78   Temp 98 F (36.7 C) (Oral)   Resp 18   Ht 5\' 9"  (1.753 m)   Wt 125 kg   SpO2 100%   BMI 40.70 kg/m  Physical Exam Vitals and nursing note reviewed.  Constitutional:      Appearance: He is not ill-appearing or toxic-appearing.  HENT:     Head: Normocephalic and atraumatic.     Mouth/Throat:     Mouth: Mucous membranes are moist.     Pharynx: No oropharyngeal exudate or posterior oropharyngeal erythema.  Eyes:     General:        Right eye: No discharge.        Left eye: No discharge.     Extraocular Movements: Extraocular movements intact.     Conjunctiva/sclera: Conjunctivae normal.     Pupils: Pupils are equal, round, and reactive to light.  Cardiovascular:     Rate and Rhythm: Normal rate and regular rhythm.     Pulses: Normal pulses.     Heart  sounds: Normal heart sounds. No murmur heard. Pulmonary:     Effort: Pulmonary effort is normal. No respiratory distress.     Breath sounds: Normal breath sounds. No wheezing or rales.  Abdominal:     General: Bowel sounds are normal. There is no distension.     Tenderness: There is no abdominal tenderness.  Musculoskeletal:        General: No deformity.     Cervical back: Neck supple.     Right lower leg: No edema.     Left lower leg: No edema.  Skin:    General: Skin is warm and dry.     Capillary Refill: Capillary refill takes less than 2 seconds.  Neurological:     General: No focal deficit present.  Mental Status: He is alert and oriented to person, place, and time. Mental status is at baseline.  Psychiatric:        Mood and Affect: Mood normal.     ED Results / Procedures / Treatments   Labs (all labs ordered are listed, but only abnormal results are displayed) Labs Reviewed  COMPREHENSIVE METABOLIC PANEL - Abnormal; Notable for the following components:      Result Value   Glucose, Bld 124 (*)    All other components within normal limits  CBC WITH DIFFERENTIAL/PLATELET  TSH  TROPONIN I (HIGH SENSITIVITY)  TROPONIN I (HIGH SENSITIVITY)    EKG EKG Interpretation  Date/Time:  Wednesday January 19 2022 22:53:16 EDT Ventricular Rate:  82 PR Interval:  144 QRS Duration: 90 QT Interval:  337 QTC Calculation: 394 R Axis:   46 Text Interpretation: Sinus rhythm Confirmed by Marily Memos [phone]) on 01/19/2022 11:51:38 PM  Radiology DG Chest 2 View  Result Date: 01/19/2022 CLINICAL DATA:  Chest pain for few days, initial encounter EXAM: CHEST - 2 VIEW COMPARISON:  None Available. FINDINGS: The heart size and mediastinal contours are within normal limits. Both lungs are clear. The visualized skeletal structures are unremarkable. IMPRESSION: No active cardiopulmonary disease. Electronically Signed   By: Alcide Clever M.D.   On: 01/19/2022 22:43    Procedures Procedures     Medications Ordered in ED Medications  acetaminophen (TYLENOL) tablet 650 mg (650 mg Oral Given 01/19/22 2345)    ED Course/ Medical Decision Making/ A&P                           Medical Decision Making 31 year old male presents with concern for intermittent central chest pain for the last 3 days associated palpitations.  Patient is mildly tachycardic to 109 on intake, hypertensive but vital signs otherwise normal.  Cardiopulmonary and abdominal exams are benign at time my evaluation, neurovascular intact in all extremities without extremity edema.  Neurologically at patient's baseline at this time.  Without focal deficit.  Differential diagnosis includes limited to ACS, PE, pleural effusion, pneumonia, pneumothorax, GERD.   Amount and/or Complexity of Data Reviewed Labs: ordered.    Details: CBC unremarkable, CMP unremarkable, troponin is negative, 6 then 5.   TSH normal Radiology: independent interpretation performed.    Details: Chest x-ray visualized by this provider, negative for acute cardiopulmonary disease agree with radiologist ECG/medicine tests: independent interpretation performed.    Details: EKG with normal sinus rhythm without QTc prolongation, dysrhythmia or ischemic changes.  Risk OTC drugs.   Suspect GERD as etiology for patient's symptoms given his history with poor compliance with PPI at home.  All concerns were more emergent underlying etiology that warrant further ED work-up or inpatient management is exceedingly low.  Will provide cardiology follow-up for this patient in the inpatient setting given reported palpitations.  Otherwise patient is well-appearing, hemodynamically stable and does not deserve any further work-up in the emergency department.  Mandela and his wife voiced understanding of her medical evaluation and treatment plan. Each of their questions answered to their expressed satisfaction.  Return precautions were given.  Patient is well-appearing,  stable, and was discharged in good condition.  This chart was dictated using voice recognition software, Dragon. Despite the best efforts of this provider to proofread and correct errors, errors may still occur which can change documentation meaning.  Final Clinical Impression(s) / ED Diagnoses Final diagnoses:  Chest pain, unspecified type  Rx / DC Orders ED Discharge Orders     None         Sherrilee GillesSponseller, Palma Buster R, PA-C 01/20/22 [id number]    Marily MemosMesner, Jason, MD 01/21/22 Earle [id number]

## 2022-01-20 ENCOUNTER — Ambulatory Visit: Payer: No Typology Code available for payment source | Admitting: Neurology

## 2022-01-20 LAB — TROPONIN I (HIGH SENSITIVITY): Troponin I (High Sensitivity): 5 ng/L (ref <18)

## 2022-01-20 LAB — TSH: TSH: 1.994 u[IU]/mL (ref 0.350–4.500)

## 2022-01-20 NOTE — Discharge Instructions (Signed)
You were seen in the ER today for your chest pain. While the exact cause remains unclear there does not appear to be any emergent problem at this time. Please follow up with your primary care doctor, and with the cardiologist listed below for your palpitations. Return to the ER with any worsening chest pain, difficulty breathing, or any other new severe symptoms.

## 2022-01-20 NOTE — ED Notes (Signed)
Pt verbalized understanding of d/c instructions, meds, and followup care. Denies questions. VSS, no distress noted. Steady gait to exit with all belongings.  ?

## 2022-01-26 NOTE — Progress Notes (Deleted)
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  2  Follow up with cardiology for  of PFO closure 3  Lipitor 40mg  daily.  LDL goal less than 70. 4  Tylenol for headaches.  No triptans in setting of stroke.   Subjective:  Bobby Haynes is a 31 year old left-handed male who follows up for headaches and stroke.  UPDATE: Due to stroke, he was advised to discontinue rizatriptan.  Underwent PFO closure in March.  Continues on ASA and Plavix.  Since then, he has experienced chest pain, palpitations (as well as feeling his heart skips a beat), memory deficits and fatigue.  Seen in ED on 6/7 for ongoing chest pain.  Cardiac workup unremarkable.  Chest pain believed to be due to GERD.  ***  Rescue protocol:  Excedrin (sometimes ibuprofen), Benadryl, Zofran Frequency of pain relievers: 3 to 4 times a week Current NSAIDS/analgesics:  ibuprofen, Tylenol, Excedrin Current triptans:  none CONTRAINDICATED (H/O STROKE) Current ergotamine:  none CONTRAINDICATED (H/O STROKE) Current anti-emetic:  Zofran ODT 4mg  Current muscle relaxants:  none Current Antihypertensive medications:  none Current Antidepressant medications:  trazodone 50mg  daily Current Anticonvulsant medications:  none Current anti-CGRP:  none Current Vitamins/Herbal/Supplements:  none Current Antihistamines/Decongestants:  Benadryl, hydroxyzine Other therapy:  none Hormone/birth control:  none Other medications:  Buspar  Caffeine:  3 cups coffee daily.  Soda not daily Diet:  1 gallon of water daily.  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 Location:  across forehead Quality: pounding Intensity:  5-6/10.   Aura:  sometimes preceded by blurred vision Prodrome:  absent Associated symptoms:  Nausea, photophobia, phonophobia.  He denies associated vomiting, autonomic symptoms, unilateral numbness or weakness. Duration:  several months Frequency:  varies but on average once a month Triggers:  change in weather Relieving factors:  rest Activity:  aggravates   September, he had a persistent migraine for 2 weeks.  Went to ED and found to have elevated blood pressure.   2.  Tension-type Headache Onset:  2021 after contracting Covid Location:  across forehead Quality:  throbbing, sometimes pressure Intensity:  3-4/10.   Aura:  absent Prodrome:  absent Associated symptoms:  None.   Duration:  all day Frequency:  every other day    Past NSAIDS/analgesics:  Tylenol Past abortive triptans:  rizatriptan. CONTRAINDICATED - HISTORY OF STROKE Past abortive ergotamine:  none.  CONTRAINDICATED - HISTORY OF STROKE Past muscle relaxants:  none Past anti-emetic:  none Past antihypertensive medications:  none Past antidepressant medications:  none Past anticonvulsant medications:  none Past anti-CGRP:  none Past vitamins/Herbal/Supplements:  none Past antihistamines/decongestants:  none Other past therapies:  none  Family history of headache:  mom and dad (migraines)  Stroke: On 09/04/2021 he was on the toilet having a bowel movement when he developed blurred vision and blacked out for a couple of seconds.  He woke up with left arm numbness and weakness.  He then developed left sided facial droop and slurred speech.  Symptoms resolved after 5 minutes.  He went to [facility] ED where CT head showed no acute intracranial abnormality but follow up MRI of brain  showed 6 mm acute ischemic cortical infarct involving the right parietal lobe/postcentral gyrus.  CTA head showed no hemodynamically significant stenosis or occlusion.  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  Telemetry revealed no afib.  Hypercoagulable panel was negative.  Hgb A1c was 5.8 and LDL was 158.  2D echo showed EF 60-65% with negative bubble study but TEE was positive for PFO.  He was discharged on ASA 81mg  and Plavix 75mg  daily as well as started on Lipitor 40mg  daily.  He is going to follow up with Dr. of cardiology to discuss PFO closure.  Scheduled for closure on 2/22.  When gets fatigued midday body feels weak.  A couple of times face numb and left side felt numb again, likely recrudescence of previous symptoms.  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   PAST MEDICAL HISTORY: Past Medical History:  Diagnosis Date   Depression    GERD (gastroesophageal reflux disease)    Insomnia    Stroke [facility])     MEDICATIONS: Current Outpatient Medications on File Prior to Visit  Medication Sig Dispense Refill   acetaminophen (TYLENOL) 500 MG tablet Take 1,000 mg by mouth every 6 (six) hours as needed for moderate pain.     albuterol (VENTOLIN HFA) 108 (90 Base) MCG/ACT inhaler Inhale 2 puffs into the lungs every 6 (six) hours as needed for shortness of breath or wheezing.     aspirin EC 81 MG EC tablet Take 1 tablet (81 mg total) by mouth daily. Swallow whole. 30 tablet 0   atorvastatin (LIPITOR) 40 MG tablet Take 1 tablet (40 mg total) by mouth daily. (Patient taking differently: Take 40 mg by mouth at bedtime.) 30 tablet 0   benzonatate (TESSALON) 100 MG capsule Take 100 mg by mouth 3 (three) times daily as needed for cough.     busPIRone (BUSPAR) 10 MG tablet Take 5 mg by mouth 2 (two) times daily.     cetirizine  (ZYRTEC) 10 MG tablet Take 10 mg by mouth at bedtime.     Chlorphen-Pseudoephed-APAP (THERAFLU FLU/COLD PO) Take 30 mLs by mouth every 6 (six) hours as needed (cold symptoms).     chlorpheniramine (CHLOR-TRIMETON) 4 MG tablet Take 4 mg by mouth every 4 (four) hours as needed (nasal drainage).     clopidogrel (PLAVIX) 75 MG tablet Take 1 tablet (75 mg total) by mouth daily. 30 tablet 0   doxycycline (MONODOX) 100 MG capsule Take 100 mg by mouth See admin instructions. Bid x 7 days     fluticasone (FLONASE) 50 MCG/ACT nasal spray Place 2 sprays into both nostrils daily as needed for allergies or rhinitis.     HYDROcodone-acetaminophen (NORCO/VICODIN) 5-325 MG tablet Take 1 tablet by mouth every 6 (six) hours as needed. (Patient not taking: Reported on 01/19/2022) 8 tablet 0   hydrOXYzine (ATARAX/VISTARIL) 10 MG tablet Take 5 mg by mouth daily as needed for anxiety.     ketotifen (ZADITOR) 0.025 % ophthalmic solution Place 2 drops into both eyes 2 (two) times daily as needed (alleriges).     Magnesium 400 MG TABS Take 400 mg by mouth daily.     montelukast (SINGULAIR) 10 MG tablet  Take 10 mg by mouth at bedtime.     Multiple Vitamins-Minerals (EMERGEN-C IMMUNE) PACK Take 1 packet by mouth 2 (two) times daily as needed (immune support).     pseudoephedrine (SUDAFED) 60 MG tablet Take 60 mg by mouth every 6 (six) hours as needed for congestion.     Pseudoephedrine-APAP-DM (DAYQUIL PO) Take 30 mLs by mouth every 6 (six) hours as needed (cold symptoms).     traZODone (DESYREL) 50 MG tablet Take 25 mg by mouth at bedtime.     zinc gluconate 50 MG tablet Take 50 mg by mouth daily.     No current facility-administered medications on file prior to visit.    ALLERGIES: Allergies  Allergen Reactions   Primaquine     Unknown reaction    FAMILY HISTORY: Family History  Problem Relation Age of Onset   Dementia Maternal Grandmother    Dementia Maternal Grandfather       Objective:  *** General: No  acute distress.  Patient appears well-groomed.   Head:  Normocephalic/atraumatic Eyes:  Fundi examined but not visualized Neck: supple, no paraspinal tenderness, full range of motion Heart:  Regular rate and rhythm Lungs:  Clear to auscultation bilaterally Back: No paraspinal tenderness Neurological Exam: alert and oriented to person, place, and time.  Speech fluent and not dysarthric, language intact.  CN II-XII intact. Bulk and tone normal, muscle strength 5/5 throughout.  Sensation to light touch intact.  Deep tendon reflexes 2+ throughout, toes downgoing.  Finger to nose testing intact.  Gait normal, Romberg negative.   Irfan Veal, DO       

## 2022-01-31 ENCOUNTER — Ambulatory Visit: Payer: No Typology Code available for payment source | Admitting: Neurology

## 2022-02-09 NOTE — Progress Notes (Deleted)
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  2  Follow up with cardiology for  of PFO closure 3  Lipitor 40mg  daily.  LDL goal less than 70. 4  Tylenol for headaches.  No triptans in setting of stroke.   Subjective:  Bobby Haynes is a 31 year old left-handed male who follows up for headaches and stroke.  UPDATE: Due to stroke, he was advised to discontinue rizatriptan.  Underwent PFO closure in March.  Continues on ASA and Plavix.  Since then, he has experienced chest pain, palpitations (as well as feeling his heart skips a beat), memory deficits and fatigue.  Seen in ED on 6/7 for ongoing chest pain.  Cardiac workup unremarkable.  Chest pain believed to be due to GERD.  ***  Rescue protocol:  Excedrin (sometimes ibuprofen), Benadryl, Zofran Frequency of pain relievers: 3 to 4 times a week Current NSAIDS/analgesics:  ibuprofen, Tylenol, Excedrin Current triptans:  none CONTRAINDICATED (H/O STROKE) Current ergotamine:  none CONTRAINDICATED (H/O STROKE) Current anti-emetic:  Zofran ODT 4mg  Current muscle relaxants:  none Current Antihypertensive medications:  none Current Antidepressant medications:  trazodone 50mg  daily Current Anticonvulsant medications:  none Current anti-CGRP:  none Current Vitamins/Herbal/Supplements:  none Current Antihistamines/Decongestants:  Benadryl, hydroxyzine Other therapy:  none Hormone/birth control:  none Other medications:  Buspar  Caffeine:  3 cups coffee daily.  Soda not daily Diet:  1 gallon of water daily.  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 Location:  across forehead Quality: pounding Intensity:  5-6/10.   Aura:  sometimes preceded by blurred vision Prodrome:  absent Associated symptoms:  Nausea, photophobia, phonophobia.  He denies associated vomiting, autonomic symptoms, unilateral numbness or weakness. Duration:  several months Frequency:  varies but on average once a month Triggers:  change in weather Relieving factors:  rest Activity:  aggravates   September, he had a persistent migraine for 2 weeks.  Went to ED and found to have elevated blood pressure.   2.  Tension-type Headache Onset:  2021 after contracting Covid Location:  across forehead Quality:  throbbing, sometimes pressure Intensity:  3-4/10.   Aura:  absent Prodrome:  absent Associated symptoms:  None.   Duration:  all day Frequency:  every other day    Past NSAIDS/analgesics:  Tylenol Past abortive triptans:  rizatriptan. CONTRAINDICATED - HISTORY OF STROKE Past abortive ergotamine:  none.  CONTRAINDICATED - HISTORY OF STROKE Past muscle relaxants:  none Past anti-emetic:  none Past antihypertensive medications:  none Past antidepressant medications:  none Past anticonvulsant medications:  none Past anti-CGRP:  none Past vitamins/Herbal/Supplements:  none Past antihistamines/decongestants:  none Other past therapies:  none  Family history of headache:  mom and dad (migraines)  Stroke: On 09/04/2021 he was on the toilet having a bowel movement when he developed blurred vision and blacked out for a couple of seconds.  He woke up with left arm numbness and weakness.  He then developed left sided facial droop and slurred speech.  Symptoms resolved after 5 minutes.  He went to [facility] ED where CT head showed no acute intracranial abnormality but follow up MRI of brain  showed 6 mm acute ischemic cortical infarct involving the right parietal lobe/postcentral gyrus.  CTA head showed no hemodynamically significant stenosis or occlusion.  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  Telemetry revealed no afib.  Hypercoagulable panel was negative.  Hgb A1c was 5.8 and LDL was 158.  2D echo showed EF 60-65% with negative bubble study but TEE was positive for PFO.  He was discharged on ASA 81mg  and Plavix 75mg  daily as well as started on Lipitor 40mg  daily.  He is going to follow up with Dr. of cardiology to discuss PFO closure.  Scheduled for closure on 2/22.  When gets fatigued midday body feels weak.  A couple of times face numb and left side felt numb again, likely recrudescence of previous symptoms.  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   PAST MEDICAL HISTORY: Past Medical History:  Diagnosis Date   Depression    GERD (gastroesophageal reflux disease)    Insomnia    Stroke [facility])     MEDICATIONS: Current Outpatient Medications on File Prior to Visit  Medication Sig Dispense Refill   acetaminophen (TYLENOL) 500 MG tablet Take 1,000 mg by mouth every 6 (six) hours as needed for moderate pain.     albuterol (VENTOLIN HFA) 108 (90 Base) MCG/ACT inhaler Inhale 2 puffs into the lungs every 6 (six) hours as needed for shortness of breath or wheezing.     aspirin EC 81 MG EC tablet Take 1 tablet (81 mg total) by mouth daily. Swallow whole. 30 tablet 0   atorvastatin (LIPITOR) 40 MG tablet Take 1 tablet (40 mg total) by mouth daily. (Patient taking differently: Take 40 mg by mouth at bedtime.) 30 tablet 0   benzonatate (TESSALON) 100 MG capsule Take 100 mg by mouth 3 (three) times daily as needed for cough.     busPIRone (BUSPAR) 10 MG tablet Take 5 mg by mouth 2 (two) times daily.     cetirizine  (ZYRTEC) 10 MG tablet Take 10 mg by mouth at bedtime.     Chlorphen-Pseudoephed-APAP (THERAFLU FLU/COLD PO) Take 30 mLs by mouth every 6 (six) hours as needed (cold symptoms).     chlorpheniramine (CHLOR-TRIMETON) 4 MG tablet Take 4 mg by mouth every 4 (four) hours as needed (nasal drainage).     clopidogrel (PLAVIX) 75 MG tablet Take 1 tablet (75 mg total) by mouth daily. 30 tablet 0   doxycycline (MONODOX) 100 MG capsule Take 100 mg by mouth See admin instructions. Bid x 7 days     fluticasone (FLONASE) 50 MCG/ACT nasal spray Place 2 sprays into both nostrils daily as needed for allergies or rhinitis.     HYDROcodone-acetaminophen (NORCO/VICODIN) 5-325 MG tablet Take 1 tablet by mouth every 6 (six) hours as needed. (Patient not taking: Reported on 01/19/2022) 8 tablet 0   hydrOXYzine (ATARAX/VISTARIL) 10 MG tablet Take 5 mg by mouth daily as needed for anxiety.     ketotifen (ZADITOR) 0.025 % ophthalmic solution Place 2 drops into both eyes 2 (two) times daily as needed (alleriges).     Magnesium 400 MG TABS Take 400 mg by mouth daily.     montelukast (SINGULAIR) 10 MG tablet  Take 10 mg by mouth at bedtime.     Multiple Vitamins-Minerals (EMERGEN-C IMMUNE) PACK Take 1 packet by mouth 2 (two) times daily as needed (immune support).     pseudoephedrine (SUDAFED) 60 MG tablet Take 60 mg by mouth every 6 (six) hours as needed for congestion.     Pseudoephedrine-APAP-DM (DAYQUIL PO) Take 30 mLs by mouth every 6 (six) hours as needed (cold symptoms).     traZODone (DESYREL) 50 MG tablet Take 25 mg by mouth at bedtime.     zinc gluconate 50 MG tablet Take 50 mg by mouth daily.     No current facility-administered medications on file prior to visit.    ALLERGIES: Allergies  Allergen Reactions   Primaquine     Unknown reaction    FAMILY HISTORY: Family History  Problem Relation Age of Onset   Dementia Maternal Grandmother    Dementia Maternal Grandfather       Objective:  *** General: No  acute distress.  Patient appears well-groomed.   Head:  Normocephalic/atraumatic Eyes:  Fundi examined but not visualized Neck: supple, no paraspinal tenderness, full range of motion Heart:  Regular rate and rhythm Lungs:  Clear to auscultation bilaterally Back: No paraspinal tenderness Neurological Exam: alert and oriented to person, place, and time.  Speech fluent and not dysarthric, language intact.  CN II-XII intact. Bulk and tone normal, muscle strength 5/5 throughout.  Sensation to light touch intact.  Deep tendon reflexes 2+ throughout, toes downgoing.  Finger to nose testing intact.  Gait normal, Romberg negative.   Shon Millet, DO

## 2022-02-10 ENCOUNTER — Ambulatory Visit: Payer: No Typology Code available for payment source | Admitting: Neurology

## 2022-02-14 ENCOUNTER — Telehealth: Payer: Self-pay

## 2022-02-14 NOTE — Telephone Encounter (Signed)
Checking the status of [facility]. No answer. LMOVM please call us back patient has an appt 02/17/22.

## 2022-02-16 NOTE — Progress Notes (Unsigned)
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  2  Follow up with cardiology for  of PFO closure 3  Lipitor 40mg  daily.  LDL goal less than 70. 4  Tylenol for headaches.  No triptans in setting of stroke.   Subjective:  Bobby Haynes is a 31 year old left-handed male who follows up for headaches and stroke.  UPDATE: Due to stroke, he was advised to discontinue rizatriptan.  Underwent PFO closure in March.  Continues on ASA and Plavix.  Since then, he has experienced chest pain, palpitations (as well as feeling his heart skips a beat), memory deficits and fatigue.  Seen in ED on 6/7 for ongoing chest pain.  Cardiac workup unremarkable.  Chest pain believed to be due to GERD.  ***  Rescue protocol:  Excedrin (sometimes ibuprofen), Benadryl, Zofran Frequency of pain relievers: 3 to 4 times a week Current NSAIDS/analgesics:  ibuprofen, Tylenol, Excedrin Current triptans:  none CONTRAINDICATED (H/O STROKE) Current ergotamine:  none CONTRAINDICATED (H/O STROKE) Current anti-emetic:  Zofran ODT 4mg  Current muscle relaxants:  none Current Antihypertensive medications:  none Current Antidepressant medications:  trazodone 50mg  daily Current Anticonvulsant medications:  none Current anti-CGRP:  none Current Vitamins/Herbal/Supplements:  none Current Antihistamines/Decongestants:  Benadryl, hydroxyzine Other therapy:  none Hormone/birth control:  none Other medications:  Buspar  Caffeine:  3 cups coffee daily.  Soda not daily Diet:  1 gallon of water daily.  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 Location:  across forehead Quality: pounding Intensity:  5-6/10.   Aura:  sometimes preceded by blurred vision Prodrome:  absent Associated symptoms:  Nausea, photophobia, phonophobia.  He denies associated vomiting, autonomic symptoms, unilateral numbness or weakness. Duration:  several months Frequency:  varies but on average once a month Triggers:  change in weather Relieving factors:  rest Activity:  aggravates   September, he had a persistent migraine for 2 weeks.  Went to ED and found to have elevated blood pressure.   2.  Tension-type Headache Onset:  2021 after contracting Covid Location:  across forehead Quality:  throbbing, sometimes pressure Intensity:  3-4/10.   Aura:  absent Prodrome:  absent Associated symptoms:  None.   Duration:  all day Frequency:  every other day    Past NSAIDS/analgesics:  Tylenol Past abortive triptans:  rizatriptan. CONTRAINDICATED - HISTORY OF STROKE Past abortive ergotamine:  none.  CONTRAINDICATED - HISTORY OF STROKE Past muscle relaxants:  none Past anti-emetic:  none Past antihypertensive medications:  none Past antidepressant medications:  none Past anticonvulsant medications:  none Past anti-CGRP:  none Past vitamins/Herbal/Supplements:  none Past antihistamines/decongestants:  none Other past therapies:  none  Family history of headache:  mom and dad (migraines)  Stroke: On 09/04/2021 he was on the toilet having a bowel movement when he developed blurred vision and blacked out for a couple of seconds.  He woke up with left arm numbness and weakness.  He then developed left sided facial droop and slurred speech.  Symptoms resolved after 5 minutes.  He went to [facility] ED where CT head showed no acute intracranial abnormality but follow up MRI of brain  showed 6 mm acute ischemic cortical infarct involving the right parietal lobe/postcentral gyrus.  CTA head showed no hemodynamically significant stenosis or occlusion.  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  Telemetry revealed no afib.  Hypercoagulable panel was negative.  Hgb A1c was 5.8 and LDL was 158.  2D echo showed EF 60-65% with negative bubble study but TEE was positive for PFO.  He was discharged on ASA 81mg  and Plavix 75mg  daily as well as started on Lipitor 40mg  daily.  He is going to follow up with Dr. of cardiology to discuss PFO closure.  Scheduled for closure on 2/22.  When gets fatigued midday body feels weak.  A couple of times face numb and left side felt numb again, likely recrudescence of previous symptoms.  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   PAST MEDICAL HISTORY: Past Medical History:  Diagnosis Date   Depression    GERD (gastroesophageal reflux disease)    Insomnia    Stroke [facility])     MEDICATIONS: Current Outpatient Medications on File Prior to Visit  Medication Sig Dispense Refill   acetaminophen (TYLENOL) 500 MG tablet Take 1,000 mg by mouth every 6 (six) hours as needed for moderate pain.     albuterol (VENTOLIN HFA) 108 (90 Base) MCG/ACT inhaler Inhale 2 puffs into the lungs every 6 (six) hours as needed for shortness of breath or wheezing.     aspirin EC 81 MG EC tablet Take 1 tablet (81 mg total) by mouth daily. Swallow whole. 30 tablet 0   atorvastatin (LIPITOR) 40 MG tablet Take 1 tablet (40 mg total) by mouth daily. (Patient taking differently: Take 40 mg by mouth at bedtime.) 30 tablet 0   benzonatate (TESSALON) 100 MG capsule Take 100 mg by mouth 3 (three) times daily as needed for cough.     busPIRone (BUSPAR) 10 MG tablet Take 5 mg by mouth 2 (two) times daily.     cetirizine  (ZYRTEC) 10 MG tablet Take 10 mg by mouth at bedtime.     Chlorphen-Pseudoephed-APAP (THERAFLU FLU/COLD PO) Take 30 mLs by mouth every 6 (six) hours as needed (cold symptoms).     chlorpheniramine (CHLOR-TRIMETON) 4 MG tablet Take 4 mg by mouth every 4 (four) hours as needed (nasal drainage).     clopidogrel (PLAVIX) 75 MG tablet Take 1 tablet (75 mg total) by mouth daily. 30 tablet 0   doxycycline (MONODOX) 100 MG capsule Take 100 mg by mouth See admin instructions. Bid x 7 days     fluticasone (FLONASE) 50 MCG/ACT nasal spray Place 2 sprays into both nostrils daily as needed for allergies or rhinitis.     HYDROcodone-acetaminophen (NORCO/VICODIN) 5-325 MG tablet Take 1 tablet by mouth every 6 (six) hours as needed. (Patient not taking: Reported on 01/19/2022) 8 tablet 0   hydrOXYzine (ATARAX/VISTARIL) 10 MG tablet Take 5 mg by mouth daily as needed for anxiety.     ketotifen (ZADITOR) 0.025 % ophthalmic solution Place 2 drops into both eyes 2 (two) times daily as needed (alleriges).     Magnesium 400 MG TABS Take 400 mg by mouth daily.     montelukast (SINGULAIR) 10 MG tablet  Take 10 mg by mouth at bedtime.     Multiple Vitamins-Minerals (EMERGEN-C IMMUNE) PACK Take 1 packet by mouth 2 (two) times daily as needed (immune support).     pseudoephedrine (SUDAFED) 60 MG tablet Take 60 mg by mouth every 6 (six) hours as needed for congestion.     Pseudoephedrine-APAP-DM (DAYQUIL PO) Take 30 mLs by mouth every 6 (six) hours as needed (cold symptoms).     traZODone (DESYREL) 50 MG tablet Take 25 mg by mouth at bedtime.     zinc gluconate 50 MG tablet Take 50 mg by mouth daily.     No current facility-administered medications on file prior to visit.    ALLERGIES: Allergies  Allergen Reactions   Primaquine     Unknown reaction    FAMILY HISTORY: Family History  Problem Relation Age of Onset   Dementia Maternal Grandmother    Dementia Maternal Grandfather       Objective:  *** General: No  acute distress.  Patient appears well-groomed.   Head:  Normocephalic/atraumatic Eyes:  Fundi examined but not visualized Neck: supple, no paraspinal tenderness, full range of motion Heart:  Regular rate and rhythm Lungs:  Clear to auscultation bilaterally Back: No paraspinal tenderness Neurological Exam: alert and oriented to person, place, and time.  Speech fluent and not dysarthric, language intact.  CN II-XII intact. Bulk and tone normal, muscle strength 5/5 throughout.  Sensation to light touch intact.  Deep tendon reflexes 2+ throughout, toes downgoing.  Finger to nose testing intact.  Gait normal, Romberg negative.   Shon Millet, DO

## 2022-02-17 ENCOUNTER — Encounter: Payer: Self-pay | Admitting: Neurology

## 2022-02-17 ENCOUNTER — Ambulatory Visit (INDEPENDENT_AMBULATORY_CARE_PROVIDER_SITE_OTHER): Payer: No Typology Code available for payment source | Admitting: Neurology

## 2022-02-17 VITALS — BP 136/86 | HR 89 | Ht 69.0 in | Wt 255.0 lb

## 2022-02-17 DIAGNOSIS — I639 Cerebral infarction, unspecified: Secondary | ICD-10-CM

## 2022-02-17 DIAGNOSIS — G44219 Episodic tension-type headache, not intractable: Secondary | ICD-10-CM | POA: Diagnosis not present

## 2022-02-17 DIAGNOSIS — G43109 Migraine with aura, not intractable, without status migrainosus: Secondary | ICD-10-CM | POA: Diagnosis not present

## 2022-02-17 DIAGNOSIS — Q2112 Patent foramen ovale: Secondary | ICD-10-CM | POA: Diagnosis not present

## 2022-02-17 NOTE — Patient Instructions (Signed)
Take magnesium citrate 400mg  daily, riboflavin (B2) 400mg  daily and Coenzyme Q-10 100mg  three times dailyl Tylenol as needed for headache attack but Limit use  to no more than 2 days out of week to prevent risk of rebound or medication-overuse headache. Keep headache diary Follow up 6 months.

## 2022-02-17 NOTE — Progress Notes (Signed)
Copy of this visit faxed to the [place].

## 2022-05-20 ENCOUNTER — Encounter: Payer: Self-pay | Admitting: Neurology

## 2022-08-03 NOTE — Progress Notes (Signed)
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  LDL goal less than 7 - Normotensive blood pressure - Hgb A1c goal less than 7 2  He would like to avoid prescription medication.  He will reconsider supplements (magnesium citrate, riboflavin, CoQ10) 3  Tylenol for headaches.  No triptans in setting of stroke.  Limit use of pain relievers to no more than 2 days out of week to prevent risk of rebound or medication-overuse headache. 4  Follow up as needed.   Subjective:  Bobby Haynes is a 31 year old left-handed male who follows up for headaches and stroke.   UPDATE: Status post PFO closure.  On ASA.  Off Plavix.  Feeling well  Has not been taking he supplements but headaches not bad.  No migraines but still with tension type headache.  They are moderate, occurring about 1 to 2 days a week, a few hours with Tylenol.  Triggered by emotional distress over concentrating hard on work.  Rescue protocol:  Tylenol Current NSAIDS/analgesics:  Tylenol Current triptans:  none CONTRAINDICATED (H/O STROKE) Current ergotamine:  none CONTRAINDICATED (H/O STROKE) Current anti-emetic:  Zofran ODT 4mg  Current muscle relaxants:  none Current Antihypertensive medications:  none Current Antidepressant medications:  trazodone 50mg  daily Current Anticonvulsant medications:  none Current anti-CGRP:  none Current Vitamins/Herbal/Supplements:  none Current Antihistamines/Decongestants:  Benadryl, hydroxyzine Other therapy:  none Hormone/birth control:  none Other medications:  Buspar   Caffeine:  3 cups coffee daily.  Soda not daily Diet:  1 gallon of  water daily.  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 Location:  across forehead Quality: pounding Intensity:  5-6/10.   Aura:  sometimes preceded by blurred vision Prodrome:  absent Associated symptoms:  Nausea, photophobia, phonophobia.  He denies associated vomiting, autonomic symptoms, unilateral numbness or weakness. Duration:  several months Frequency:  varies but on average once a month Triggers:  change in weather Relieving factors:  rest Activity:  aggravates   September, he had a persistent migraine for 2 weeks.  Went to ED and found to have elevated blood pressure.   2.  Tension-type Headache Onset:  2021 after contracting Covid Location:  across forehead Quality:  throbbing, sometimes pressure Intensity:  3-4/10.   Aura:  absent Prodrome:  absent Associated symptoms:  None.   Duration:  all day Frequency:  every other day     Past NSAIDS/analgesics:  ibuprofen, Excedrin Past abortive triptans:  rizatriptan. CONTRAINDICATED - HISTORY OF STROKE Past abortive ergotamine:  none.  CONTRAINDICATED - HISTORY OF STROKE Past muscle relaxants:  none Past anti-emetic:  none Past antihypertensive medications:  none Past antidepressant medications:  none Past anticonvulsant medications:  none Past anti-CGRP:  none Past vitamins/Herbal/Supplements:  none Past antihistamines/decongestants:  none Other past therapies:  none   Family history of headache:  mom and dad (migraines)   Stroke: On 09/04/2021 he was on the toilet having a bowel movement when he  developed blurred vision and blacked out for a couple of seconds.  He woke up with left arm numbness and weakness.  He then developed left sided facial droop and slurred speech.   Symptoms resolved after 5 minutes.  He went to [facility] ED where CT head showed no acute intracranial abnormality but follow up MRI of brain showed 6 mm acute ischemic cortical infarct involving the right parietal lobe/postcentral gyrus.  CTA head showed no hemodynamically significant stenosis or occlusion.  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  Telemetry revealed no afib.  Hypercoagulable panel was negative.  Hgb A1c was 5.8 and LDL was 158.  2D echo showed EF 60-65% with negative bubble study but TEE was positive for PFO.  He was discharged on ASA 81mg  and Plavix 75mg  daily as well as started on Lipitor 40mg  daily.  He is going to follow up with Dr. of cardiology to discuss PFO closure.  Scheduled for closure on 2/22.  When gets fatigued midday body feels weak.  A couple of times face numb and left side felt numb again, likely recrudescence of previous symptoms.  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 Underwent PFO closure in March.  Continues on ASA and Plavix.  Since then, he has experienced chest pain, palpitations (as well as feeling his heart skips a beat), memory deficits and fatigue.  Seen in ED on 6/7 for ongoing chest pain.  Cardiac workup unremarkable.  Chest pain believed to be due to GERD.   PAST MEDICAL HISTORY: Past Medical History:  Diagnosis Date   Depression    GERD (gastroesophageal reflux disease)    Insomnia    Stroke [facility])     MEDICATIONS: Current Outpatient Medications on File Prior to Visit  Medication Sig Dispense Refill   acetaminophen (TYLENOL) 500 MG tablet Take 1,000 mg by mouth every 6 (six) hours as needed for moderate pain.     albuterol (VENTOLIN HFA) 108 (90 Base) MCG/ACT inhaler Inhale 2 puffs into the lungs every 6 (six) hours as needed for shortness of breath or wheezing.     aspirin  EC 81 MG EC tablet Take 1 tablet (81 mg total) by mouth daily. Swallow whole. 30 tablet 0   atorvastatin (LIPITOR) 40 MG tablet Take 1 tablet (40 mg total) by mouth daily. (Patient taking differently: Take 40 mg by mouth at bedtime.) 30 tablet 0   benzonatate (TESSALON) 100 MG capsule Take 100 mg by mouth 3 (three) times daily as needed for cough. (Patient not taking: Reported on 02/17/2022)     busPIRone (BUSPAR) 10 MG tablet Take 5 mg by mouth 2 (two) times daily. (Patient not taking: Reported on 02/17/2022)     cetirizine (ZYRTEC) 10 MG tablet Take 10 mg by mouth at bedtime.     Chlorphen-Pseudoephed-APAP (THERAFLU FLU/COLD PO) Take 30 mLs by mouth every 6 (six) hours as needed (cold symptoms). (Patient not taking: Reported on 02/17/2022)     chlorpheniramine (CHLOR-TRIMETON) 4 MG tablet Take 4 mg by mouth every 4 (four) hours as needed (nasal drainage).     clopidogrel (PLAVIX) 75 MG tablet Take 1 tablet (75 mg total) by mouth daily. 30 tablet 0   doxycycline (MONODOX) 100 MG capsule Take 100 mg by mouth See admin instructions. Bid x 7 days (Patient not taking: Reported on 02/17/2022)  fluticasone (FLONASE) 50 MCG/ACT nasal spray Place 2 sprays into both nostrils daily as needed for allergies or rhinitis.     HYDROcodone-acetaminophen (NORCO/VICODIN) 5-325 MG tablet Take 1 tablet by mouth every 6 (six) hours as needed. (Patient not taking: Reported on 01/19/2022) 8 tablet 0   hydrOXYzine (ATARAX/VISTARIL) 10 MG tablet Take 5 mg by mouth daily as needed for anxiety. (Patient not taking: Reported on 02/17/2022)     ketotifen (ZADITOR) 0.025 % ophthalmic solution Place 2 drops into both eyes 2 (two) times daily as needed (alleriges). (Patient not taking: Reported on 02/17/2022)     Magnesium 400 MG TABS Take 400 mg by mouth daily. (Patient not taking: Reported on 02/17/2022)     montelukast (SINGULAIR) 10 MG tablet Take 10 mg by mouth at bedtime.     Multiple Vitamins-Minerals (EMERGEN-C IMMUNE) PACK Take 1  packet by mouth 2 (two) times daily as needed (immune support). (Patient not taking: Reported on 02/17/2022)     pseudoephedrine (SUDAFED) 60 MG tablet Take 60 mg by mouth every 6 (six) hours as needed for congestion.     Pseudoephedrine-APAP-DM (DAYQUIL PO) Take 30 mLs by mouth every 6 (six) hours as needed (cold symptoms). (Patient not taking: Reported on 02/17/2022)     traZODone (DESYREL) 50 MG tablet Take 25 mg by mouth at bedtime.     zinc gluconate 50 MG tablet Take 50 mg by mouth daily. (Patient not taking: Reported on 02/17/2022)     No current facility-administered medications on file prior to visit.    ALLERGIES: Allergies  Allergen Reactions   Primaquine     Unknown reaction    FAMILY HISTORY: Family History  Problem Relation Age of Onset   Diabetes Mother    High blood pressure Mother    Diabetes Father    High blood pressure Father    Dementia Maternal Grandmother    Dementia Maternal Grandfather       Objective:  Blood pressure (!) 147/79, pulse 86, height 5\' 9"  (1.753 m), weight 250 lb 3.2 oz (113.5 kg), SpO2 98 %. General: No acute distress.  Patient appears well-groomed.   Head:  Normocephalic/atraumatic Eyes:  Fundi examined but not visualized Neck: supple, no paraspinal tenderness, full range of motion Heart:  Regular rate and rhythm Neurological Exam: alert and oriented to person, place, and time.  Speech fluent and not dysarthric, language intact.  CN II-XII intact. Bulk and tone normal, muscle strength 5/5 throughout.  Sensation to light touch intact.  Deep tendon reflexes 2+ throughout, toes downgoing.  Finger to nose testing intact.  Gait normal, Romberg negative.   , DO  CC: [facility]

## 2022-08-09 ENCOUNTER — Encounter: Payer: Self-pay | Admitting: Neurology

## 2022-08-09 ENCOUNTER — Ambulatory Visit (INDEPENDENT_AMBULATORY_CARE_PROVIDER_SITE_OTHER): Payer: No Typology Code available for payment source | Admitting: Neurology

## 2022-08-09 VITALS — BP 147/79 | HR 86 | Ht 69.0 in | Wt 250.2 lb

## 2022-08-09 DIAGNOSIS — G44219 Episodic tension-type headache, not intractable: Secondary | ICD-10-CM

## 2022-08-09 DIAGNOSIS — I639 Cerebral infarction, unspecified: Secondary | ICD-10-CM | POA: Diagnosis not present

## 2022-08-09 DIAGNOSIS — G43109 Migraine with aura, not intractable, without status migrainosus: Secondary | ICD-10-CM | POA: Diagnosis not present

## 2022-08-19 ENCOUNTER — Telehealth: Payer: Self-pay | Admitting: Cardiology

## 2022-08-19 ENCOUNTER — Other Ambulatory Visit: Payer: Self-pay | Admitting: Cardiology

## 2022-08-19 DIAGNOSIS — Q2112 Patent foramen ovale: Secondary | ICD-10-CM

## 2022-08-19 NOTE — Telephone Encounter (Signed)
Attempted to call the patient in regards to OV and echo appointment change from 11/11/22 to 11/18/22. LVM with new appointment dates and times.   Kathyrn Drown NP-C Structural Heart Team  Pager: [phone] Phone: [phone]

## 2022-10-12 ENCOUNTER — Emergency Department (HOSPITAL_BASED_OUTPATIENT_CLINIC_OR_DEPARTMENT_OTHER): Payer: No Typology Code available for payment source

## 2022-10-12 ENCOUNTER — Emergency Department (HOSPITAL_BASED_OUTPATIENT_CLINIC_OR_DEPARTMENT_OTHER)
Admission: EM | Admit: 2022-10-12 | Discharge: 2022-10-12 | Disposition: A | Discharge status: Home / Self Care | Payer: No Typology Code available for payment source | Attending: Emergency Medicine | Admitting: Emergency Medicine

## 2022-10-12 ENCOUNTER — Encounter (HOSPITAL_BASED_OUTPATIENT_CLINIC_OR_DEPARTMENT_OTHER): Payer: Self-pay | Admitting: Emergency Medicine

## 2022-10-12 ENCOUNTER — Other Ambulatory Visit: Payer: Self-pay

## 2022-10-12 DIAGNOSIS — R0789 Other chest pain: Secondary | ICD-10-CM | POA: Diagnosis present

## 2022-10-12 LAB — CBC
HCT: 42.6 % (ref 39.0–52.0)
Hemoglobin: 14.5 g/dL (ref 13.0–17.0)
MCH: 29.5 pg (ref 26.0–34.0)
MCHC: 34 g/dL (ref 30.0–36.0)
MCV: 86.8 fL (ref 80.0–100.0)
Platelets: 276 10*3/uL (ref 150–400)
RBC: 4.91 MIL/uL (ref 4.22–5.81)
RDW: 12.4 % (ref 11.5–15.5)
WBC: 8 10*3/uL (ref 4.0–10.5)
nRBC: 0 % (ref 0.0–0.2)

## 2022-10-12 LAB — BASIC METABOLIC PANEL
Anion gap: 6 (ref 5–15)
BUN: 13 mg/dL (ref 6–20)
CO2: 29 mmol/L (ref 22–32)
Calcium: 8.8 mg/dL — ABNORMAL LOW (ref 8.9–10.3)
Chloride: 103 mmol/L (ref 98–111)
Creatinine, Ser: 0.94 mg/dL (ref 0.61–1.24)
GFR, Estimated: >60 mL/min (ref >60)
Glucose, Bld: 97 mg/dL (ref 70–99)
Potassium: 4 mmol/L (ref 3.5–5.1)
Sodium: 138 mmol/L (ref 135–145)

## 2022-10-12 LAB — TROPONIN I (HIGH SENSITIVITY)
Troponin I (High Sensitivity): 3 ng/L (ref <18)
Troponin I (High Sensitivity): 3 ng/L (ref <18)

## 2022-10-12 MED ORDER — SODIUM CHLORIDE 0.9 % IV BOLUS
500.0000 mL | Freq: Once | INTRAVENOUS | Status: AC
  Administered 2022-10-12: 500 mL via INTRAVENOUS

## 2022-10-12 MED ORDER — IOHEXOL 350 MG/ML SOLN
100.0000 mL | Freq: Once | INTRAVENOUS | Status: AC | PRN
Start: 2022-10-12 — End: 2022-10-12
  Administered 2022-10-12: 100 mL via INTRAVENOUS

## 2022-10-12 MED ORDER — ALUM & MAG HYDROXIDE-SIMETH 200-200-20 MG/5ML PO SUSP
30.0000 mL | Freq: Once | ORAL | Status: AC
  Administered 2022-10-12: 30 mL via ORAL
  Filled 2022-10-12: qty 30

## 2022-10-12 NOTE — ED Triage Notes (Signed)
Pt c/o sudden onset of dizziness, nausea, CP , LUE weakness and clammy after lunch; hx of stroke in Jan 23 and PFO closure surg in Mar 23; CP was mid chest, sharp w/ some radiation to LT neck

## 2022-10-12 NOTE — ED Notes (Signed)
Discharge paperwork reviewed entirely with patient, including Rx's and follow up care. Pain was under control. Pt verbalized understanding as well as all parties involved. No questions or concerns voiced at the time of discharge. No acute distress noted.   Pt ambulated out to PVA without incident or assistance.

## 2022-10-12 NOTE — ED Provider Notes (Signed)
[place] [facility] Provider Note   CSN: [id number] Arrival date & time: 10/12/22  1249     History  Chief Complaint  Patient presents with   Chest Pain    Bobby Haynes is a 32 y.o. 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 Patient presents to ED for evaluation of chest pain, nausea, lightheadedness, LUE weakness.  Patient reports that this morning he woke up feeling fine.  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  Patient states that he went to lunch with his friends, had pho for lunch and went back to work.  Patient states that upon arriving back to work he had sudden onset of left-sided chest pain, nausea, left upper extremity weakness and lightheadedness.  Patient states that the symptoms felt very similar to when he had stroke back in January.  Patient reports that he contacted his girlfriend who drove him to the ED for evaluation.  On exam, the patient reports his chest tightness has decreased.  Patient still complaining of sensation in throat, states that left upper extremity weakness has resolved as well.  Patient goes on to states that he does take Adderall once daily 5 mg for memory issues, was started on this by psychiatry.  Patient states that he has not taken his Adderall in a few days, today was the first time he taken it in 5 days.  Patient also reports that he has history of GERD, noncompliant on PPI therapy.  Patient denies any one-sided numbness, slurred speech, facial droop, shortness of breath, vomiting, diarrhea.  Chest Pain Associated symptoms: nausea and weakness   Associated symptoms: no numbness, no shortness of breath and no vomiting        Home Medications Prior to Admission medications   Medication Sig Start Date End Date Taking? Authorizing Provider  acetaminophen  (TYLENOL) 500 MG tablet Take 1,000 mg by mouth every 6 (six) hours as needed for moderate pain.   Yes [provider]  albuterol (VENTOLIN HFA) 108 (90 Base) MCG/ACT inhaler Inhale 2 puffs into the lungs every 6 (six) hours as needed for shortness of breath or wheezing. 09/21/20  Yes [provider]  amphetamine-dextroamphetamine (ADDERALL) 5 MG tablet Take 5 mg by mouth 2 (two) times daily.   Yes [provider]  aspirin EC 81 MG EC tablet Take 1 tablet (81 mg total) by mouth daily. Swallow whole. 09/08/21  Yes Thurnell Lose, MD  atorvastatin (LIPITOR) 40 MG tablet Take 1 tablet (40 mg total) by mouth daily. Patient taking differently: Take 40 mg by mouth at bedtime. 09/08/21  Yes Thurnell Lose, MD  cetirizine (ZYRTEC) 10 MG tablet Take 10 mg by mouth at bedtime.   Yes [provider]  fluticasone (FLONASE) 50 MCG/ACT nasal spray Place 2 sprays into both nostrils at bedtime.   Yes [provider]  ketotifen (ZADITOR) 0.025 % ophthalmic solution Place 2 drops into both eyes 2 (two) times daily as needed (alleriges).   Yes [provider]  Magnesium 400 MG TABS Take 400 mg by mouth daily.   Yes [provider]  montelukast (SINGULAIR) 10 MG tablet Take 10 mg by mouth at bedtime. 03/01/21  Yes [provider]  Multiple Vitamin (MULTIVITAMIN ADULT PO) Take 1 tablet by mouth daily.   Yes [provider]  Multiple Vitamins-Minerals (EMERGEN-C IMMUNE) PACK Take 1 packet by mouth 2 (two) times daily as needed (immune support).   Yes [provider]  traZODone (DESYREL) 50 MG tablet Take 25 mg by mouth at bedtime. 04/14/21  Yes [provider]  zinc gluconate 50 MG tablet Take 50 mg by mouth daily.   Yes [provider]      Allergies    Primaquine    Review of Systems   Review of Systems  Respiratory:  Negative for shortness of breath.   Cardiovascular:  Positive for chest pain.   Gastrointestinal:  Positive for nausea. Negative for diarrhea and vomiting.  Neurological:  Positive for weakness and light-headedness. Negative for facial asymmetry, speech difficulty and numbness.  All other systems reviewed and are negative.   Physical Exam Updated Vital Signs BP 114/79   Pulse 80   Temp 98.2 F (36.8 C)   Resp (!) 23   Ht '5\' 4"'$  (1.626 m)   Wt 114.3 kg   SpO2 99%   BMI 43.26 kg/m  Physical Exam Vitals and nursing note reviewed.  Constitutional:      General: He is not in acute distress.    Appearance: Normal appearance. He is not ill-appearing, toxic-appearing or diaphoretic.  HENT:     Head: Normocephalic and atraumatic.     Nose: Nose normal. No congestion.     Mouth/Throat:     Mouth: Mucous membranes are moist.     Pharynx: Oropharynx is clear.  Eyes:     Extraocular Movements: Extraocular movements intact.     Conjunctiva/sclera: Conjunctivae normal.     Pupils: Pupils are equal, round, and reactive to light.  Cardiovascular:     Rate and Rhythm: Normal rate and regular rhythm.  Pulmonary:     Effort: Pulmonary effort is normal.     Breath sounds: Normal breath sounds. No wheezing.  Abdominal:     General: Abdomen is flat. Bowel sounds are normal.     Palpations: Abdomen is soft.     Tenderness: There is no abdominal tenderness.  Musculoskeletal:     Cervical back: Normal range of motion and neck supple. No tenderness.     Right lower leg: No edema.     Left lower leg: No edema.  Skin:    General: Skin is warm and dry.     Capillary Refill: Capillary refill takes less than 2 seconds.  Neurological:     General: No focal deficit present.     Mental Status: He is alert and oriented to person, place, and time.     GCS: GCS eye subscore is 4. GCS verbal subscore is 5. GCS motor subscore is 6.     Cranial Nerves: Cranial nerves 2-12 are intact. No cranial nerve deficit.     Sensory: Sensation is intact. No sensory deficit.     Motor: Motor  function is intact. No weakness.     Coordination: Coordination is intact. Heel to [facility] Test normal.     ED Results / Procedures / Treatments   Labs (all labs ordered are listed, but only abnormal results are displayed) Labs Reviewed  BASIC METABOLIC PANEL - Abnormal; Notable for the following components:      Result Value   Calcium 8.8 (*)    All other components within normal limits  CBC  TROPONIN I (HIGH SENSITIVITY)  TROPONIN I (HIGH SENSITIVITY)    EKG EKG Interpretation  Date/Time:  Wednesday October 12 2022 13:06:09 EST Ventricular  Rate:  102 PR Interval:  142 QRS Duration: 91 QT Interval:  319 QTC Calculation: 416 R Axis:   106 Text Interpretation: Sinus tachycardia Right axis deviation Probable anteroseptal infarct, old Borderline T abnormalities, inferior leads flipped te aves in inferior leads not seen on prior Otherwise no significant change Confirmed by Deno Etienne [phone]) on 10/12/2022 1:20:29 PM  Radiology CT Angio Chest PE W and/or Wo Contrast  Result Date: 10/12/2022 CLINICAL DATA:  Pulmonary embolism (PE) suspected, high prob EXAM: CT ANGIOGRAPHY CHEST WITH CONTRAST TECHNIQUE: Multidetector CT imaging of the chest was performed using the standard protocol during bolus administration of intravenous contrast. Multiplanar CT image reconstructions and MIPs were obtained to evaluate the vascular anatomy. RADIATION DOSE REDUCTION: This exam was performed according to the departmental dose-optimization program which includes automated exposure control, adjustment of the mA and/or kV according to patient size and/or use of iterative reconstruction technique. CONTRAST:  156m OMNIPAQUE IOHEXOL 350 MG/ML SOLN COMPARISON:  Chest XR, concurrent. FINDINGS: Suboptimal evaluation, secondary to motion degradation at poor contrast opacification such that subsegmental emboli could missed. Cardiovascular: Satisfactory opacification of the pulmonary arteries to the segmental level. No  segmental or larger pulmonary embolus. Normal heart size. No pericardial effusion.  Atrial septal occluder. Mediastinum/Nodes: No enlarged mediastinal, hilar, or axillary lymph nodes. Thyroid gland, trachea, and esophagus demonstrate no significant findings. Lungs/Pleura: Lungs are clear without focal consolidation, mass or suspicious pulmonary nodule. No pleural effusion or pneumothorax. Upper Abdomen: No acute abnormality. Musculoskeletal: No acute chest wall abnormality. No acute or significant osseous findings. Review of the MIP images confirms the above findings. IMPRESSION: 1. No segmental or larger pulmonary embolus. 2. No acute intrathoracic abnormality. Electronically Signed   By: JMichaelle BirksM.D.   On: 10/12/2022 15:19   DG Chest Port 1 View  Result Date: 10/12/2022 CLINICAL DATA:  Chest pain, dizziness EXAM: PORTABLE CHEST 1 VIEW COMPARISON:  Radiographs 01/19/2022 FINDINGS: The heart size and mediastinal contours are within normal limits.No focal airspace disease. No pleural effusion or pneumothorax.No acute osseous abnormality. IMPRESSION: No evidence of acute cardiopulmonary disease. Electronically Signed   By: JMaurine SimmeringM.D.   On: 10/12/2022 14:09   CT Head Wo Contrast  Result Date: 10/12/2022 CLINICAL DATA:  Sudden onset of left upper extremity weakness. History of prior stroke. Dizziness. EXAM: CT HEAD WITHOUT CONTRAST TECHNIQUE: Contiguous axial images were obtained from the base of the skull through the vertex without intravenous contrast. RADIATION DOSE REDUCTION: This exam was performed according to the departmental dose-optimization program which includes automated exposure control, adjustment of the mA and/or kV according to patient size and/or use of iterative reconstruction technique. COMPARISON:  MRI 09/05/2021 FINDINGS: Brain: No sign of old or acute infarction by CT. Tiny right frontoparietal vertex cortical infarction which was acute in January of 2023 cannot be visualized. No  mass lesion, hemorrhage, hydrocephalus or extra-axial collection. Vascular: No abnormal vascular finding. Skull: Negative Sinuses/Orbits: Clear/normal Other: None IMPRESSION: No acute CT finding. The CT scan is normal. Tiny right frontoparietal vertex cortical infarction which was acute in January of 2023 cannot be visualized. Electronically Signed   By: MNelson ChimesM.D.   On: 10/12/2022 14:01    Procedures Procedures   Medications Ordered in ED Medications  alum & mag hydroxide-simeth (MAALOX/MYLANTA) 200-200-20 MG/5ML suspension 30 mL (30 mLs Oral Given 10/12/22 1411)  sodium chloride 0.9 % bolus 500 mL (0 mLs Intravenous Stopped 10/12/22 1500)  iohexol (OMNIPAQUE) 350 MG/ML injection 100 mL (100 mLs Intravenous Contrast Given  10/12/22 1458)    ED Course/ Medical Decision Making/ A&P  Medical Decision Making Amount and/or Complexity of Data Reviewed Labs: ordered. Radiology: ordered.  Risk OTC drugs. Prescription drug management.   32 year old male presents to ED for evaluation.  Please see HPI for further details.  On examination the patient is afebrile and nontachycardic.  Patient with sounds are clear bilaterally, he is not hypoxic on room air.  Abdomen soft and compressible throughout.  Neurological examination shows no focal neurodeficits.  Patient nontoxic in appearance with no peripheral edema.   Patient workup will include CBC, BMP, troponin x 2, plain film imaging of chest, CT scan of head, CT angiogram due to the fact patient presented tachycardic.  Will also collect EKG.  Will attempt to control patient pain with GI cocktail to rule out GERD as cause of symptoms.  Will also provide patient 500 mL fluid  CBC unremarkable without leukocytosis or anemia.  BMP unremarkable with no electrolyte derangement, stable creatinine.  Patient troponin 3 and 3 respectively.  Plain film imaging of chest shows no consolidations or effusions.  CT head shows no evidence of acute infarct.  CT  angio chest shows no evidence of pulmonary embolism.  After receiving 500 mL of fluid, GI cocktail patient reports he feels better at this time.  Patient could be suffering from postop fatigue, states that he has never had an episode this severe.  Patient will be advised to follow-up with his cardiologist, neurologist for further management.  The patient was given return precautions and he voiced understanding with these.  The patient had all his questions answered to his satisfaction.  The patient is stable for discharge.  Final Clinical Impression(s) / ED Diagnoses Final diagnoses:  Atypical chest pain    Rx / DC Orders ED Discharge Orders     None         Azucena Cecil, PA-C 10/12/22 [place], DO 10/13/22 [phone]

## 2022-10-12 NOTE — Discharge Instructions (Signed)
Please return to the ED with any new or worsening signs or symptoms Please follow-up with cardiology and neurology teams for further management Please read the attached guide concerning nonspecific chest pain in adults Please see attached note excusing from work

## 2022-11-11 ENCOUNTER — Ambulatory Visit: Payer: No Typology Code available for payment source

## 2022-11-11 ENCOUNTER — Other Ambulatory Visit (HOSPITAL_COMMUNITY): Payer: Self-pay

## 2022-11-11 ENCOUNTER — Other Ambulatory Visit (HOSPITAL_COMMUNITY): Payer: No Typology Code available for payment source

## 2022-11-18 ENCOUNTER — Ambulatory Visit: Payer: No Typology Code available for payment source | Attending: Cardiology | Admitting: Cardiology

## 2022-11-18 ENCOUNTER — Ambulatory Visit (HOSPITAL_COMMUNITY): Payer: No Typology Code available for payment source | Attending: Cardiology

## 2022-11-18 VITALS — BP 124/86 | HR 88 | Ht 69.0 in | Wt 246.2 lb

## 2022-11-18 DIAGNOSIS — Q2112 Patent foramen ovale: Secondary | ICD-10-CM | POA: Insufficient documentation

## 2022-11-18 DIAGNOSIS — I639 Cerebral infarction, unspecified: Secondary | ICD-10-CM | POA: Diagnosis not present

## 2022-11-18 LAB — ECHOCARDIOGRAM LIMITED BUBBLE STUDY
Area-P 1/2: 4.49 cm2
S' Lateral: 3.3 cm

## 2022-11-18 NOTE — Patient Instructions (Signed)
Medication Instructions:  Your physician recommends that you continue on your current medications as directed. Please refer to the Current Medication list given to you today.  *If you need a refill on your cardiac medications before your next appointment, please call your pharmacy*   Lab Work: NONE If you have labs (blood work) drawn today and your tests are completely normal, you will receive your results only by: MyChart Message (if you have MyChart) OR A paper copy in the mail If you have any lab test that is abnormal or we need to change your treatment, we will call you to review the results.   Testing/Procedures: NONE   Follow-Up: At [facility], you and your health needs are our priority.  As part of our continuing mission to provide you with exceptional heart care, we have created designated Provider Care Teams.  These Care Teams include your primary Cardiologist (physician) and Advanced Practice Providers (APPs -  Physician Assistants and Nurse Practitioners) who all work together to provide you with the care you need, when you need it.  We recommend signing up for the patient portal called "MyChart".  Sign up information is provided on this After Visit Summary.  MyChart is used to connect with patients for Virtual Visits (Telemedicine).  Patients are able to view lab/test results, encounter notes, upcoming appointments, etc.  Non-urgent messages can be sent to your provider as well.   To learn more about what you can do with MyChart, go to [email or web address].    Your next appointment:   AS NEEDED

## 2022-11-21 ENCOUNTER — Telehealth: Payer: Self-pay | Admitting: Cardiology

## 2022-11-21 NOTE — Progress Notes (Signed)
[facility]   MULTIDISCIPLINARY HEART VALVE CLINIC                                     Cardiology Office Note:    Date:  11/21/2022   ID:  Bobby Haynes, DOB 1991/04/27, MRN [id number]  [facility]  [facility] Cardiologist:  Tonny BollmanMichael Cooper, MD  [facility] Electrophysiologist:  None   Referring MD: [facility]   Chief Complaint  Patient presents with   Follow-up    1 year s/p PFO   History of Present Illness:    Bobby Haynes is a 32 y.o. male with a hx of obesity and migraines who was referred to Dr. Excell Seltzerooper for PFO closure which was performed 10/14/21.    [facility] for evaluation of transient right-sided numbness, aphagia and blurred vision while having a bowel movement. Head CT showed no acute intracranial abnormality. 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 MRA showed wide patency of both carotid arteries and R vertebral artery diffusely hypoplastic and terminates in PICA, dominant L vertebral artery. CTA showed no abnormal anterior or posterior circulation. Echocardiogram showed an EF 60 to 65% with a negative bubble study. Bilateral lower extremity Dopplers were negative for DVT. Hypercoagulable work-up has been negative. TEE on 09/06/2021 showed EF 60 to 65% with evidence of atrial level shunting by color-flow Doppler as well as a moderately sized PFO with predominantly right to left shunting across the atrial septum.   He was seen by Dr. Excell Seltzerooper with plans to proceed with PFO closure which was performed 10/14/21 with 25 mm Amplatzer Talisman PFO occluder device Recommendations were for DAPT with ASA and plavix x 6 months.   Today he is here for one year follow up and reports that he is doing very well with no chest pain, SOB, LE edema, no neuro changes. He is back working out in [employer] and feels good.  Echo today reviewed and discussed with patient which is negative for shunting/bubble on my exam.   Past Medical History:  Diagnosis Date   Depression    GERD (gastroesophageal reflux disease)    Insomnia    Stroke    Past Surgical History:  Procedure Laterality Date   BUBBLE STUDY  09/06/2021   Procedure: BUBBLE STUDY;  Surgeon: Bobby Haynes, Mark C, MD;  Location: [facility];  Service: Cardiovascular;;   KNEE SURGERY     PATENT FORAMEN OVALE(PFO) CLOSURE N/A 10/14/2021   Procedure: PATENT FORAMEN OVALE (PFO) CLOSURE;  Surgeon: Tonny Bollmanooper, Michael, MD;  [facility];  Service: Cardiovascular;  Laterality: N/A;   TEE WITHOUT CARDIOVERSION N/A 09/06/2021   Procedure: TRANSESOPHAGEAL ECHOCARDIOGRAM (TEE);  Surgeon: Bobby Haynes, Mark C, MD;  [facility];  Service: Cardiovascular;  Laterality: N/A;   Current Medications: Current Meds  Medication Sig   acetaminophen (TYLENOL) 500 MG tablet Take 1,000 mg by mouth every 6 (six) hours as needed for moderate pain.   albuterol (VENTOLIN HFA) 108 (90 Base) MCG/ACT inhaler Inhale 2 puffs into the lungs every 6 (six) hours as needed for shortness of breath or wheezing.   amphetamine-dextroamphetamine (ADDERALL) 5 MG tablet Take 5 mg by mouth 2 (two) times daily.   aspirin EC 81 MG EC tablet Take 1 tablet (81 mg total) by mouth daily. Swallow  whole.   atorvastatin (LIPITOR) 40 MG tablet Take 1 tablet (40 mg total) by mouth daily. (Patient taking differently: Take 40 mg by mouth at bedtime.)   cetirizine (ZYRTEC) 10 MG tablet Take 10 mg by mouth at bedtime.   fluticasone (FLONASE) 50 MCG/ACT nasal spray Place 2 sprays into both nostrils at bedtime.   ketotifen (ZADITOR) 0.025 % ophthalmic solution Place 2 drops into both eyes 2 (two) times daily as needed (alleriges).   montelukast (SINGULAIR) 10 MG tablet Take 10 mg by mouth at bedtime.   Multiple Vitamin (MULTIVITAMIN ADULT PO) Take 1 tablet by mouth daily.   Multiple  Vitamins-Minerals (EMERGEN-C IMMUNE) PACK Take 1 packet by mouth 2 (two) times daily as needed (immune support).   traZODone (DESYREL) 50 MG tablet Take 25 mg by mouth at bedtime.    Allergies:   Primaquine   Social History   Socioeconomic History   Marital status: Married    Spouse name: Not on file   Number of children: Not on file   Years of education: Not on file   Highest education level: Not on file  Occupational History   Not on file  Tobacco Use   Smoking status: Never    Passive exposure: Never   Smokeless tobacco: Never  Vaping Use   Vaping Use: Never used  Substance and Sexual Activity   Alcohol use: Yes    Comment: occ   Drug use: Never   Sexual activity: Not on file  Other Topics Concern   Not on file  Social History Narrative   Left handed   Lives with wife on 2nd floor apartment   Caffeine 1 cup in am   Social Determinants of Health   Financial Resource Strain: Not on file  Food Insecurity: Not on file  Transportation Needs: Not on file  Physical Activity: Not on file  Stress: Not on file  Social Connections: Not on file    Family History: The patient's family history includes Dementia in his maternal grandfather and maternal grandmother; Diabetes in his father and mother; High blood pressure in his father and mother.  ROS:   Please see the history of present illness.    All other systems reviewed and are negative.  EKGs/Labs/Other Studies Reviewed:    The following studies were reviewed today:  Echo with bubble 11/18/22:   1. Post PFO Closure on 10/14/2021. Agitated saline contrast bubble study  was negative, with no evidence of any interatrial shunt.   2. Left ventricular ejection fraction, by estimation, is 55 to 60%. The  left ventricle has normal function.   3. Right ventricular systolic function is normal. The right ventricular  size is normal. There is normal pulmonary artery systolic pressure. The  estimated right ventricular systolic  pressure is 17.3 mmHg.   PFO 10/14/21:  Successful transcatheter PFO closure using a 25 mm Amplatzer Talisman PFO occluder device   Recommend: DAPT with ASA and plavix x 6 months SBE prophylaxis x 6 months as indicated Same day DC protocol if criteria met   EKG:  EKG is not ordered today.    Recent Labs: 01/19/2022: ALT 35; TSH 1.994 10/12/2022: BUN 13; Creatinine, Ser 0.94; Hemoglobin 14.5; Platelets 276; Potassium 4.0; Sodium 138   Recent Lipid Panel    Component Value Date/Time   CHOL 230 (H) 09/05/2021 0635   TRIG 115 09/05/2021 0635   HDL 49 09/05/2021 0635   CHOLHDL 4.7 09/05/2021 0635   VLDL 23 09/05/2021 0635   LDLCALC  158 (H) 09/05/2021 7893   Physical Exam:    VS:  BP 124/86   Pulse 88   Ht 5\' 9"  (1.753 m)   Wt 246 lb 3.2 oz (111.7 kg)   SpO2 97%   BMI 36.36 kg/m     Wt Readings from Last 3 Encounters:  11/18/22 246 lb 3.2 oz (111.7 kg)  10/12/22 252 lb (114.3 kg)  08/09/22 250 lb 3.2 oz (113.5 kg)    General: Well developed, well nourished, NAD Lungs:Clear to ausculation bilaterally. No wheezes, rales, or rhonchi. Breathing is unlabored. Cardiovascular: RRR with S1 S2. No murmurs Extremities: No edema.  Neuro: Alert and oriented. No focal deficits. No facial asymmetry. MAE spontaneously. Psych: Responds to questions appropriately with normal affect.    ASSESSMENT/PLAN:    PFO: Underwent PFO closure 10/14/21 with using a 25 mm Amplatzer Talisman PFO occluder device. Remained on DAPT with ASA and plavix x 6 months (04/2022) then transitioned to ASA monotherapy. Agitated saline contrast bubble study today  was negative, with no evidence of any interatrial shunt. Plan PRN follow up.   CVA: No neuro changes    Medication Adjustments/Labs and Tests Ordered: Current medicines are reviewed at length with the patient today.  Concerns regarding medicines are outlined above.  No orders of the defined types were placed in this encounter.  No orders of the defined  types were placed in this encounter.   Patient Instructions  Medication Instructions:  Your physician recommends that you continue on your current medications as directed. Please refer to the Current Medication list given to you today.  *If you need a refill on your cardiac medications before your next appointment, please call your pharmacy*   Lab Work: NONE If you have labs (blood work) drawn today and your tests are completely normal, you will receive your results only by: MyChart Message (if you have MyChart) OR A paper copy in the mail If you have any lab test that is abnormal or we need to change your treatment, we will call you to review the results.   Testing/Procedures: NONE   Follow-Up: At [facility], you and your health needs are our priority.  As part of our continuing mission to provide you with exceptional heart care, we have created designated Provider Care Teams.  These Care Teams include your primary Cardiologist (physician) and Advanced Practice Providers (APPs -  Physician Assistants and Nurse Practitioners) who all work together to provide you with the care you need, when you need it.  We recommend signing up for the patient portal called "MyChart".  Sign up information is provided on this After Visit Summary.  MyChart is used to connect with patients for Virtual Visits (Telemedicine).  Patients are able to view lab/test results, encounter notes, upcoming appointments, etc.  Non-urgent messages can be sent to your provider as well.   To learn more about what you can do with MyChart, go to [email or web address].    Your next appointment:   AS NEEDED      Signed, Georgie Chard, NP  11/21/2022 5:26 AM    [facility]

## 2022-11-21 NOTE — Telephone Encounter (Signed)
Bobby Haynes with [facility] is requesting a copy of 4/05 office visit notes.  Phone#: [phone] Fax#: [phone]

## 2023-07-20 IMAGING — CT CT ANGIO NECK
1 of 10 series · 5 of 33 positions shown · non-contrast
Comparison: Brain MRI, MRA head and neck 9779 hours today. Head CT
yesterday.

CLINICAL DATA: 30-year-old male with tiny posterior right MCA
cortical infarct on brain MRI this morning.

EXAM:
CT ANGIOGRAPHY HEAD AND NECK
TECHNIQUE: Multidetector CT imaging of the head and neck was performed using
the standard protocol during bolus administration of intravenous
contrast. Multiplanar CT image reconstructions and MIPs were
obtained to evaluate the vascular anatomy. Carotid stenosis
measurements (when applicable) are obtained utilizing NASCET
criteria, using the distal internal carotid diameter as the
denominator.

[Series 11: ax thins · axial · 0.39mm/px · z∈[-273,-26]mm · 5 of 371 slices shown]
[im 62/371  soft-tissue]
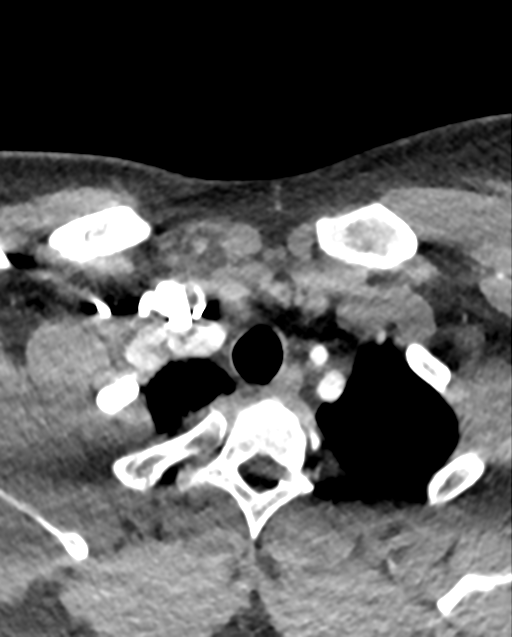
[im 124/371  bone]
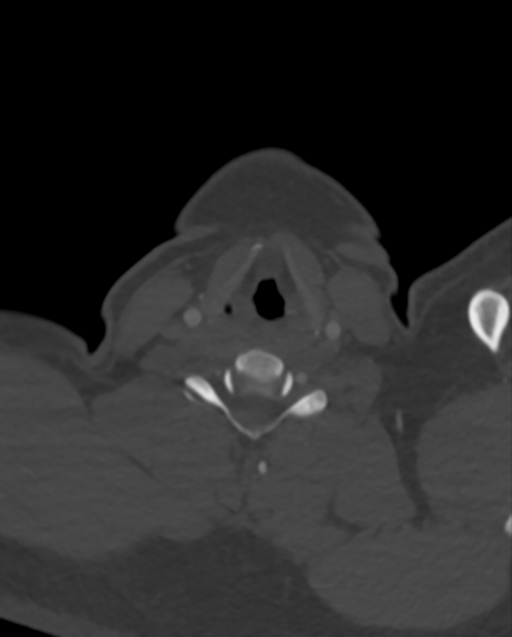
[im 186/371  soft-tissue]
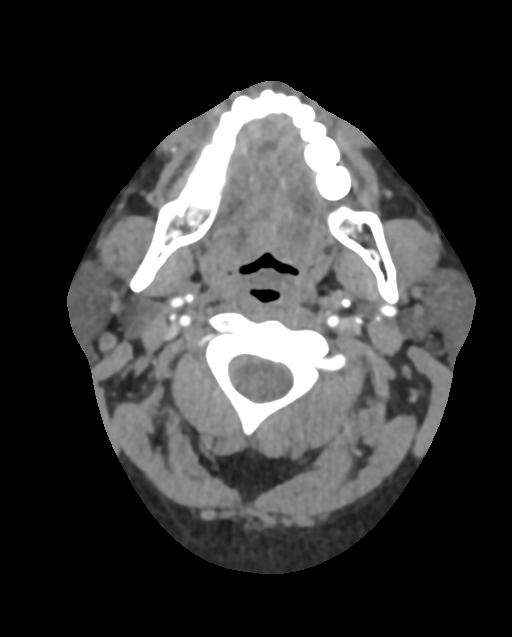
[im 247/371  bone]
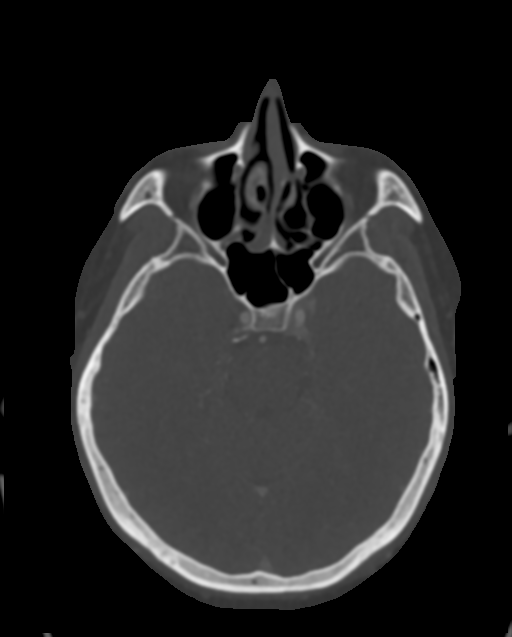
[im 309/371  soft-tissue]
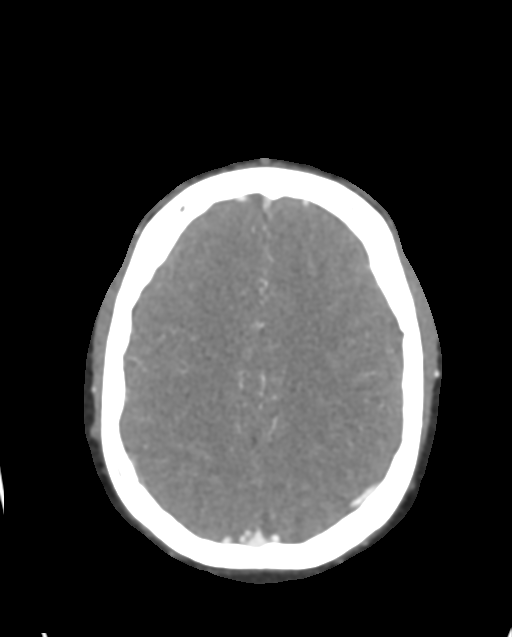

[5 of 33 positions shown; findings below may reference images not displayed]

RADIATION DOSE REDUCTION: This exam was performed according to the
departmental dose-optimization program which includes automated
exposure control, adjustment of the mA and/or kV according to
patient size and/or use of iterative reconstruction technique.

CONTRAST:  75mL OMNIPAQUE IOHEXOL 350 MG/ML SOLN
FINDINGS: CT HEAD

Brain: Small posterior right frontal lobe cortical infarct is not
apparent by CT.

No midline shift, ventriculomegaly, mass effect, evidence of mass
lesion, intracranial hemorrhage. Gray-white matter differentiation
is within normal limits throughout the brain. Normal cerebral
volume.

Calvarium and skull base: No acute osseous abnormality identified.

Paranasal sinuses: Stable maxillary sinus mucosal thickening and/or
retention cysts. Stable scattered mild ethmoid mucosal thickening.
Tympanic cavities are clear. Stable right mastoid effusion.

Orbits: Visualized orbits and scalp soft tissues are within normal
limits.

CTA NECK

Skeleton: No osseous abnormality identified.

Upper chest: Negative.

Other neck: Negative.

Aortic arch: 3 vessel arch configuration. No arch atherosclerosis.

Right carotid system: Mildly obscured right CCA origin due to dense
right subclavian venous contrast streak artifact. Otherwise the
right CCA, right carotid bifurcation and cervical right ICA appear
normal.

Left carotid system: Normal.

Vertebral arteries:
Right subclavian artery origin obscured by dense right subclavian
venous contrast. Non dominant right vertebral artery origin appears
normal. Right vertebral artery is diminutive throughout the neck,
with associated congenitally small right cervical transverse foramen
(series 11, image 203). The right vertebral is patent to the skull
base with no stenosis identified.

Normal proximal left subclavian artery dominant left vertebral
artery origin, and cervical left vertebral artery.

CTA HEAD

Posterior circulation: Left vertebral artery supplies the basilar.
Diminutive right V4 segment terminates in PICA. No distal vertebral
or basilar stenosis. Patent SCA and PCA origins. Fetal type right
PCA origin. Left posterior communicating artery diminutive or
absent. Bilateral PCA branches are within normal limits.

Anterior circulation: Both ICA siphons are patent. No siphon plaque
or stenosis identified. Right siphon appears somewhat dominant.
Normal right posterior communicating artery origin. Patent carotid
termini, MCA and ACA origins. Mildly dominant right ACA A1. Normal
anterior communicating artery. Bilateral ACA branches are tortuous
but otherwise within normal limits. Left MCA M1 segment,
bifurcation, and left MCA branches are within normal limits. Right
MCA M1 segment and bifurcation are patent without stenosis. Right
MCA branches are within normal limits.

Venous sinuses: Early contrast timing but grossly patent.

Anatomic variants: Dominant left vertebral artery supplies the
basilar. Diminutive right vertebral terminates in PICA. Fetal type
right PCA origin and mildly dominant right ACA A1.

Review of the MIP images confirms the above findings
IMPRESSION: 1. Normal CTA Head and Neck, with normal anatomic variation of the
anterior and posterior circulation.

2. Small right frontal lobe posterior cortical infarct is not
apparent by CT. No new intracranial abnormality.

## 2023-12-03 IMAGING — CR DG CHEST 2V
2 series · 2 of 2 positions shown · non-contrast
Comparison: None Available.

CLINICAL DATA: Chest pain for few days, initial encounter

EXAM:
CHEST - 2 VIEW

[chest pa]
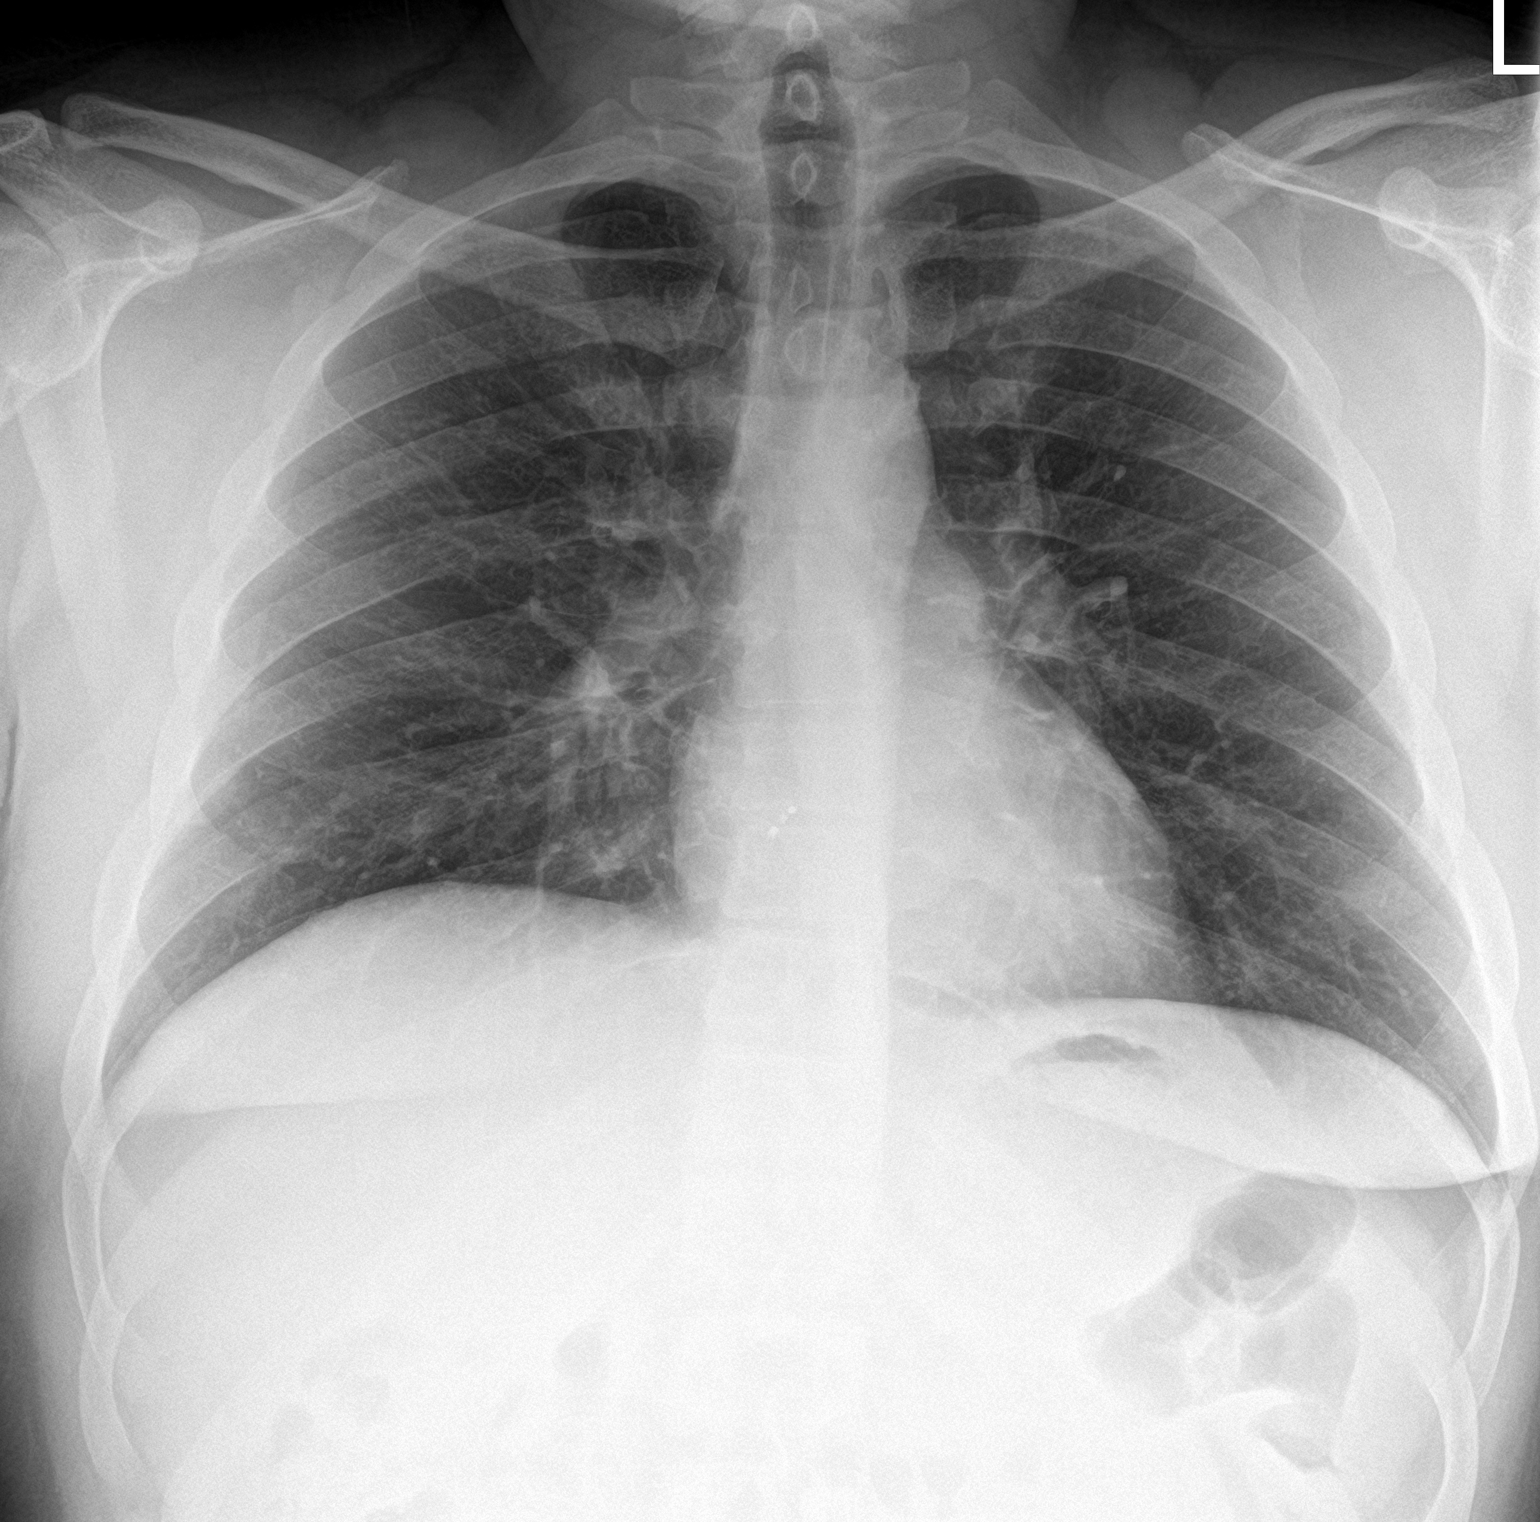

[chest lat]
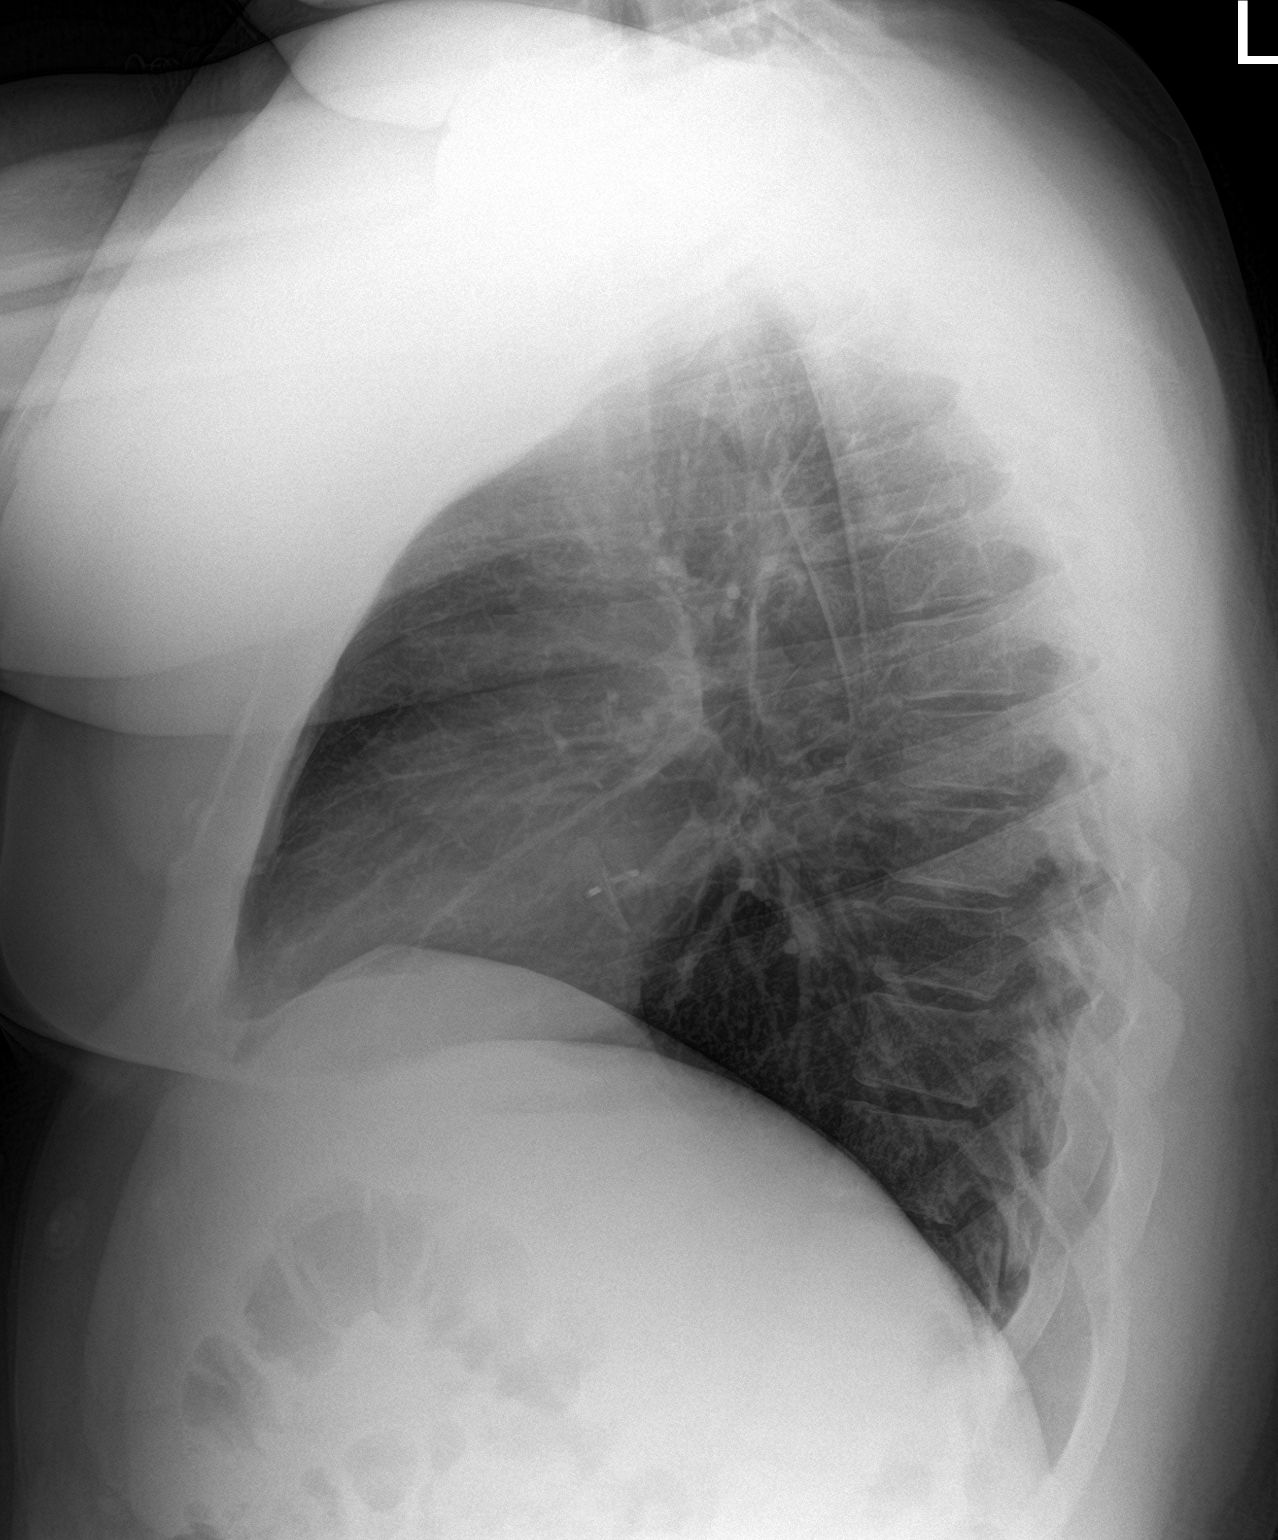

[2 of 2 positions shown; findings below may reference images not displayed]

FINDINGS: The heart size and mediastinal contours are within normal limits.
Both lungs are clear. The visualized skeletal structures are
unremarkable.
IMPRESSION: No active cardiopulmonary disease.
